# Patient Record
Sex: Male | Born: 1935 | Race: White | Hispanic: No | Marital: Married | Smoking: Former smoker
Health system: Southern US, Community
[De-identification: ages and names within clinical notes are randomized; demographics above are authoritative.]

## PROBLEM LIST (undated history)

## (undated) DIAGNOSIS — K219 Gastro-esophageal reflux disease without esophagitis: Secondary | ICD-10-CM

## (undated) DIAGNOSIS — I10 Essential (primary) hypertension: Secondary | ICD-10-CM

## (undated) DIAGNOSIS — N183 Chronic kidney disease, stage 3 unspecified: Secondary | ICD-10-CM

## (undated) DIAGNOSIS — N400 Enlarged prostate without lower urinary tract symptoms: Secondary | ICD-10-CM

## (undated) DIAGNOSIS — E119 Type 2 diabetes mellitus without complications: Secondary | ICD-10-CM

## (undated) DIAGNOSIS — I639 Cerebral infarction, unspecified: Secondary | ICD-10-CM

## (undated) HISTORY — PX: APPENDECTOMY: SHX54

## (undated) HISTORY — PX: CHOLECYSTECTOMY: SHX55

---

## 2005-10-22 ENCOUNTER — Other Ambulatory Visit: Payer: Self-pay

## 2005-10-22 ENCOUNTER — Inpatient Hospital Stay: Payer: Self-pay

## 2005-11-11 ENCOUNTER — Emergency Department: Payer: Self-pay

## 2005-11-11 ENCOUNTER — Other Ambulatory Visit: Payer: Self-pay

## 2006-04-19 ENCOUNTER — Other Ambulatory Visit: Payer: Self-pay

## 2006-04-19 ENCOUNTER — Emergency Department: Payer: Self-pay | Admitting: Emergency Medicine

## 2007-03-28 ENCOUNTER — Ambulatory Visit: Payer: Self-pay

## 2007-05-01 ENCOUNTER — Ambulatory Visit: Payer: Self-pay

## 2009-02-17 ENCOUNTER — Inpatient Hospital Stay: Payer: Self-pay | Admitting: Internal Medicine

## 2012-03-02 ENCOUNTER — Emergency Department (HOSPITAL_COMMUNITY): Payer: PRIVATE HEALTH INSURANCE

## 2012-03-02 ENCOUNTER — Observation Stay (HOSPITAL_COMMUNITY)
Admission: EM | Admit: 2012-03-02 | Discharge: 2012-03-04 | Disposition: A | Discharge status: Home Health | Payer: PRIVATE HEALTH INSURANCE | Attending: Family Medicine | Admitting: Family Medicine

## 2012-03-02 ENCOUNTER — Observation Stay (HOSPITAL_COMMUNITY): Payer: PRIVATE HEALTH INSURANCE

## 2012-03-02 ENCOUNTER — Encounter (HOSPITAL_COMMUNITY): Payer: Self-pay | Admitting: *Deleted

## 2012-03-02 DIAGNOSIS — D181 Lymphangioma, any site: Secondary | ICD-10-CM | POA: Insufficient documentation

## 2012-03-02 DIAGNOSIS — J189 Pneumonia, unspecified organism: Secondary | ICD-10-CM

## 2012-03-02 DIAGNOSIS — R42 Dizziness and giddiness: Secondary | ICD-10-CM

## 2012-03-02 DIAGNOSIS — E1010 Type 1 diabetes mellitus with ketoacidosis without coma: Secondary | ICD-10-CM | POA: Insufficient documentation

## 2012-03-02 DIAGNOSIS — R5381 Other malaise: Principal | ICD-10-CM | POA: Insufficient documentation

## 2012-03-02 DIAGNOSIS — I498 Other specified cardiac arrhythmias: Secondary | ICD-10-CM | POA: Insufficient documentation

## 2012-03-02 DIAGNOSIS — R55 Syncope and collapse: Secondary | ICD-10-CM

## 2012-03-02 DIAGNOSIS — R531 Weakness: Secondary | ICD-10-CM

## 2012-03-02 DIAGNOSIS — E1110 Type 2 diabetes mellitus with ketoacidosis without coma: Secondary | ICD-10-CM | POA: Diagnosis present

## 2012-03-02 DIAGNOSIS — R110 Nausea: Secondary | ICD-10-CM | POA: Insufficient documentation

## 2012-03-02 DIAGNOSIS — K219 Gastro-esophageal reflux disease without esophagitis: Secondary | ICD-10-CM | POA: Insufficient documentation

## 2012-03-02 DIAGNOSIS — Z8673 Personal history of transient ischemic attack (TIA), and cerebral infarction without residual deficits: Secondary | ICD-10-CM | POA: Insufficient documentation

## 2012-03-02 DIAGNOSIS — I10 Essential (primary) hypertension: Secondary | ICD-10-CM | POA: Insufficient documentation

## 2012-03-02 HISTORY — DX: Type 2 diabetes mellitus without complications: E11.9

## 2012-03-02 HISTORY — DX: Essential (primary) hypertension: I10

## 2012-03-02 LAB — CBC
Hemoglobin: 15.3 g/dL (ref 13.0–17.0)
MCH: 29.5 pg (ref 26.0–34.0)
Platelets: 234 10*3/uL (ref 150–400)
RBC: 5.19 MIL/uL (ref 4.22–5.81)
WBC: 10.3 10*3/uL (ref 4.0–10.5)

## 2012-03-02 LAB — COMPREHENSIVE METABOLIC PANEL
Albumin: 3.3 g/dL — ABNORMAL LOW (ref 3.5–5.2)
Alkaline Phosphatase: 82 U/L (ref 39–117)
BUN: 18 mg/dL (ref 6–23)
CO2: 23 mEq/L (ref 19–32)
Chloride: 100 mEq/L (ref 96–112)
GFR calc Af Amer: >90 mL/min (ref >90)
GFR calc non Af Amer: 80 mL/min — ABNORMAL LOW (ref >90)
Glucose, Bld: 289 mg/dL — ABNORMAL HIGH (ref 70–99)
Potassium: 4.8 mEq/L (ref 3.5–5.1)
Total Bilirubin: 0.2 mg/dL — ABNORMAL LOW (ref 0.3–1.2)

## 2012-03-02 LAB — CBC WITH DIFFERENTIAL/PLATELET
Basophils Absolute: 0 10*3/uL (ref 0.0–0.1)
Basophils Relative: 0 % (ref 0–1)
Hemoglobin: 15.4 g/dL (ref 13.0–17.0)
MCHC: 34.5 g/dL (ref 30.0–36.0)
Monocytes Relative: 6 % (ref 3–12)
Neutro Abs: 8.7 10*3/uL — ABNORMAL HIGH (ref 1.7–7.7)
Neutrophils Relative %: 78 % — ABNORMAL HIGH (ref 43–77)
RDW: 12.7 % (ref 11.5–15.5)

## 2012-03-02 LAB — URINALYSIS, ROUTINE W REFLEX MICROSCOPIC
Glucose, UA: >1000 mg/dL — AB
Ketones, ur: NEGATIVE mg/dL
Leukocytes, UA: NEGATIVE
Protein, ur: NEGATIVE mg/dL

## 2012-03-02 LAB — CREATININE, SERUM
Creatinine, Ser: 0.81 mg/dL (ref 0.50–1.35)
GFR calc Af Amer: >90 mL/min (ref >90)

## 2012-03-02 LAB — POCT I-STAT TROPONIN I: Troponin i, poc: 0.02 ng/mL (ref 0.00–0.08)

## 2012-03-02 LAB — PRO B NATRIURETIC PEPTIDE: Pro B Natriuretic peptide (BNP): 110.9 pg/mL (ref 0–450)

## 2012-03-02 LAB — TROPONIN I: Troponin I: <0.3 ng/mL (ref <0.30)

## 2012-03-02 LAB — URINE MICROSCOPIC-ADD ON

## 2012-03-02 LAB — GLUCOSE, CAPILLARY: Glucose-Capillary: 256 mg/dL — ABNORMAL HIGH (ref 70–99)

## 2012-03-02 MED ORDER — SODIUM CHLORIDE 0.9 % IJ SOLN
3.0000 mL | Freq: Two times a day (BID) | INTRAMUSCULAR | Status: DC
  Administered 2012-03-02 – 2012-03-04 (×3): 3 mL via INTRAVENOUS

## 2012-03-02 MED ORDER — DIPYRIDAMOLE 50 MG PO TABS
50.0000 mg | ORAL_TABLET | Freq: Two times a day (BID) | ORAL | Status: DC
  Administered 2012-03-02 – 2012-03-03 (×2): 50 mg via ORAL
  Filled 2012-03-02 (×3): qty 1

## 2012-03-02 MED ORDER — HEPARIN SODIUM (PORCINE) 5000 UNIT/ML IJ SOLN
5000.0000 [IU] | Freq: Three times a day (TID) | INTRAMUSCULAR | Status: DC
  Administered 2012-03-02 – 2012-03-04 (×6): 5000 [IU] via SUBCUTANEOUS
  Filled 2012-03-02 (×8): qty 1

## 2012-03-02 MED ORDER — DEXTROSE 5 % IV SOLN
1.0000 g | INTRAVENOUS | Status: DC
  Administered 2012-03-02: 1 g via INTRAVENOUS
  Filled 2012-03-02 (×2): qty 10

## 2012-03-02 MED ORDER — INSULIN ASPART 100 UNIT/ML ~~LOC~~ SOLN
0.0000 [IU] | Freq: Every day | SUBCUTANEOUS | Status: DC
  Administered 2012-03-02: 3 [IU] via SUBCUTANEOUS
  Administered 2012-03-03: 2 [IU] via SUBCUTANEOUS

## 2012-03-02 MED ORDER — SODIUM CHLORIDE 0.9 % IV BOLUS (SEPSIS): 500.0000 mL | Freq: Once | INTRAVENOUS | Status: DC

## 2012-03-02 MED ORDER — GLYBURIDE 1.25 MG PO TABS: 1.2500 mg | ORAL_TABLET | Freq: Every day | ORAL | Status: DC

## 2012-03-02 MED ORDER — MECLIZINE HCL 25 MG PO TABS
25.0000 mg | ORAL_TABLET | Freq: Once | ORAL | Status: AC
  Administered 2012-03-02: 25 mg via ORAL
  Filled 2012-03-02: qty 1

## 2012-03-02 MED ORDER — INSULIN ASPART 100 UNIT/ML ~~LOC~~ SOLN
0.0000 [IU] | Freq: Three times a day (TID) | SUBCUTANEOUS | Status: DC
  Administered 2012-03-03: 5 [IU] via SUBCUTANEOUS
  Administered 2012-03-03: 2 [IU] via SUBCUTANEOUS
  Administered 2012-03-03: 100 [IU] via SUBCUTANEOUS
  Administered 2012-03-04: 2 [IU] via SUBCUTANEOUS
  Administered 2012-03-04: 3 [IU] via SUBCUTANEOUS

## 2012-03-02 MED ORDER — SODIUM CHLORIDE 0.9 % IV SOLN
INTRAVENOUS | Status: DC
  Administered 2012-03-02 – 2012-03-04 (×3): via INTRAVENOUS

## 2012-03-02 MED ORDER — ONDANSETRON HCL 4 MG/2ML IJ SOLN: 4.0000 mg | Freq: Four times a day (QID) | INTRAMUSCULAR | Status: DC | PRN

## 2012-03-02 MED ORDER — ACETAMINOPHEN 325 MG PO TABS: 650.0000 mg | ORAL_TABLET | Freq: Four times a day (QID) | ORAL | Status: DC | PRN

## 2012-03-02 MED ORDER — ONDANSETRON HCL 4 MG/2ML IJ SOLN
4.0000 mg | Freq: Once | INTRAMUSCULAR | Status: AC
  Administered 2012-03-02: 4 mg via INTRAVENOUS
  Filled 2012-03-02: qty 2

## 2012-03-02 MED ORDER — METFORMIN HCL 500 MG PO TABS
250.0000 mg | ORAL_TABLET | Freq: Every day | ORAL | Status: DC
  Administered 2012-03-03: 250 mg via ORAL
  Filled 2012-03-02 (×2): qty 1

## 2012-03-02 MED ORDER — ONDANSETRON HCL 4 MG PO TABS: 4.0000 mg | ORAL_TABLET | Freq: Four times a day (QID) | ORAL | Status: DC | PRN

## 2012-03-02 MED ORDER — AMLODIPINE BESYLATE 10 MG PO TABS
10.0000 mg | ORAL_TABLET | Freq: Every day | ORAL | Status: DC
  Administered 2012-03-02 – 2012-03-04 (×3): 10 mg via ORAL
  Filled 2012-03-02 (×3): qty 1

## 2012-03-02 MED ORDER — ACETAMINOPHEN 650 MG RE SUPP: 650.0000 mg | Freq: Four times a day (QID) | RECTAL | Status: DC | PRN

## 2012-03-02 MED ORDER — AZITHROMYCIN 500 MG PO TABS
500.0000 mg | ORAL_TABLET | Freq: Every day | ORAL | Status: DC
  Administered 2012-03-02 – 2012-03-03 (×2): 500 mg via ORAL
  Filled 2012-03-02 (×2): qty 1

## 2012-03-02 MED ORDER — PANTOPRAZOLE SODIUM 40 MG PO TBEC
40.0000 mg | DELAYED_RELEASE_TABLET | Freq: Every day | ORAL | Status: DC
  Administered 2012-03-02 – 2012-03-04 (×3): 40 mg via ORAL
  Filled 2012-03-02 (×3): qty 1

## 2012-03-02 MED ORDER — GLYBURIDE 2.5 MG PO TABS
3.7500 mg | ORAL_TABLET | Freq: Every day | ORAL | Status: DC
  Administered 2012-03-03: 3.75 mg via ORAL
  Filled 2012-03-02 (×2): qty 1

## 2012-03-02 MED ORDER — HYDRALAZINE HCL 20 MG/ML IJ SOLN
5.0000 mg | INTRAMUSCULAR | Status: DC | PRN
  Filled 2012-03-02 (×2): qty 0.25

## 2012-03-02 NOTE — ED Notes (Signed)
Carb Mod Diet ordered  

## 2012-03-02 NOTE — ED Notes (Signed)
Pt here per [facility] with complaint of nausea.  Pt advises he was nauseated all morning but has not vomited. [facility] started IV and administered Zofran 4 mg IVP.

## 2012-03-02 NOTE — ED Provider Notes (Signed)
History     CSN: [id number]  Arrival date & time 03/02/12  1221   First MD Initiated Contact with Patient 03/02/12 1351      Chief Complaint  Patient presents with  . Nausea    (Consider location/radiation/quality/duration/timing/severity/associated sxs/prior treatment) Patient is a 79 y.o. male presenting with neurologic complaint.  Neurologic Problem The primary symptoms include dizziness and nausea. Primary symptoms do not include headaches, visual change, paresthesias, focal weakness, loss of sensation, speech change, memory loss, fever or vomiting. The symptoms began 2 to 6 hours ago. The symptoms are unchanged. The neurological symptoms are diffuse. Context: this morning after BM.  He describes the dizziness as lightheadedness. The dizziness began today. The dizziness has been unchanged since its onset. Dizziness is a new problem. Associated with: unknown. Dizziness also occurs with nausea and weakness. Dizziness does not occur with blurred vision, vomiting or diaphoresis.  Nausea began today. Associated with: mvmt. The nausea is exacerbated by motion.  Additional symptoms include weakness and loss of balance. Additional symptoms do not include neck stiffness, lower back pain or leg pain. Medical issues also include hypertension.    Past Medical History  Diagnosis Date  . Diabetes mellitus without complication   . Hypertension     Past Surgical History  Procedure Laterality Date  . Cholecystectomy      No family history on file.  History  Substance Use Topics  . Smoking status: Never Smoker   . Smokeless tobacco: Not on file  . Alcohol Use: No      Review of Systems  Constitutional: Negative for fever, chills, diaphoresis, activity change, appetite change and fatigue.  HENT: Negative for congestion, sore throat, facial swelling, rhinorrhea, drooling, neck pain, neck stiffness and voice change.   Eyes: Negative for blurred vision.  Respiratory: Negative for  shortness of breath and stridor.   Gastrointestinal: Positive for nausea. Negative for vomiting, abdominal pain, diarrhea and abdominal distention.  Endocrine: Negative for polydipsia and polyuria.  Genitourinary: Negative for dysuria, urgency, frequency and decreased urine volume.  Musculoskeletal: Negative for back pain and gait problem.  Skin: Negative for color change and wound.  Neurological: Positive for dizziness, weakness and loss of balance. Negative for speech change, focal weakness, facial asymmetry, numbness, headaches and paresthesias.  Hematological: Does not bruise/bleed easily.  Psychiatric/Behavioral: Negative for memory loss, confusion and agitation.    Allergies  Review of patient's allergies indicates no known allergies.  Home Medications   Current Outpatient Rx  Name  Route  Sig  Dispense  Refill  . amLODipine (NORVASC) 10 MG tablet   Oral   Take 10 mg by mouth daily.         Marland Kitchen dipyridamole (PERSANTINE) 50 MG tablet   Oral   Take 50 mg by mouth 2 (two) times daily.         Marland Kitchen glyBURIDE (DIABETA) 2.5 MG tablet   Oral   Take 3.75 mg by mouth daily with breakfast.         . metFORMIN (GLUCOPHAGE) 500 MG tablet   Oral   Take 250 mg by mouth daily.         . Multiple Vitamin (MULTIVITAMIN WITH MINERALS) TABS   Oral   Take 1 tablet by mouth daily.         Marland Kitchen omeprazole (PRILOSEC) 20 MG capsule   Oral   Take 20 mg by mouth daily.           BP 162/93  Pulse 55  Temp(Src) 97.6 F (36.4 C) (Oral)  Resp 21  Ht 6' (1.829 m)  Wt 190 lb (86.183 kg)  BMI 25.76 kg/m2  SpO2 95%  Physical Exam  Constitutional: He is oriented to person, place, and time. He appears well-developed and well-nourished. No distress.  HENT:  Head: Normocephalic and atraumatic.  Mouth/Throat: No oropharyngeal exudate.  Eyes: Pupils are equal, round, and reactive to light.  Neck: Normal range of motion. Neck supple.  Cardiovascular: Normal rate, regular rhythm and  normal heart sounds.  Exam reveals no gallop and no friction rub.   No murmur heard. Pulmonary/Chest: Effort normal and breath sounds normal. No respiratory distress. He has no wheezes. He has no rales.  Abdominal: Soft. Bowel sounds are normal. He exhibits no distension and no mass. There is tenderness in the suprapubic area. There is no rigidity, no rebound and no guarding.  Musculoskeletal: Normal range of motion. He exhibits no edema and no tenderness.  Neurological: He is alert and oriented to person, place, and time.  Skin: Skin is warm and dry.  Psychiatric: He has a normal mood and affect.    ED Course  Procedures (including critical care time)  Labs Reviewed  GLUCOSE, CAPILLARY - Abnormal; Notable for the following:    Glucose-Capillary 259 (*)    All other components within normal limits  COMPREHENSIVE METABOLIC PANEL - Abnormal; Notable for the following:    Sodium 134 (*)    Glucose, Bld 289 (*)    Albumin 3.3 (*)    Total Bilirubin 0.2 (*)    GFR calc non Af Amer 80 (*)    All other components within normal limits  URINALYSIS, ROUTINE W REFLEX MICROSCOPIC - Abnormal; Notable for the following:    Glucose, UA >1000 (*)    All other components within normal limits  CBC WITH DIFFERENTIAL - Abnormal; Notable for the following:    WBC 11.1 (*)    Neutrophils Relative 78 (*)    Neutro Abs 8.7 (*)    All other components within normal limits  URINE CULTURE  URINE MICROSCOPIC-ADD ON  POCT I-STAT TROPONIN I   [name] Contrast  03/02/2012  *RADIOLOGY REPORT*  Clinical Data: Slurred speech and memory loss.  Nausea and dizziness.  Diabetes and hypertension  MRI HEAD WITHOUT CONTRAST  Technique:  Multiplanar, multiecho pulse sequences of the brain and surrounding structures were obtained according to standard protocol without intravenous contrast.  Comparison: None  Findings: Moderate to advanced atrophy.  Marked chronic ischemic changes are present throughout the white  matter bilaterally. 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 Chronic ischemia in the pons and right cerebellum.  Negative for acute infarct.  Negative for mass lesion.  CSF hygroma is present left parietal region.  This has CSF signal characteristics.  There is mild midline shift to the right of 3 mm.  IMPRESSION: Advanced atrophy and chronic ischemic changes.  No acute infarct.  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   Original Report Authenticated By: [name], M.D.      1. Dizziness   2. Nausea       MDM  Pt is a 79 y.o. male with pertinent PMHX of DM, HTN, CVA who presents with complaint of feeling unwell starting this morning.  He reports he feels like he can't move because it makes him feel "bad". Further description is difficult but includes nausea and lightheadedness, worse with movement. Pt unable to  say if symptoms came on gradually or suddenly, but occurred this morning after he had a BM and tried to get back into bed.  Denies h/a, CP, SOB, fever, chills cough.  +suprapubic pain, and chronic urinary frequency, but no dysuria.  On PE, HR variable.  BP 174/94.  A@O  x3.  Neuro exam pertinent for R ptosis which family describes as chronic, as well as dysmetria of L hand.  Pt & family unable to say if he has difficulty with coordination in the past. Ddx includes posterior circulation CVA, BPPV, infection including viral syndrome, or UTI,   5:24PM Pt feels no better after zofran and meclizine, unable to sit up in bed due to severity of symptoms.  MRI w/ no acute ischemic changes, CSF hygroma.  Labs pertinent for mild WBC elevation.  No UTI.  Trop not elevated.  I feel PBBV or other peripheral vertigo most likely; however, given severity of symptoms, Fam medicine consulted for consideration of admission for observation  1. Dizziness   2. Nausea      Labs and imaging considered in decision making, reviewed by myself.  Imaging  interpreted by radiology. Pt care discussed with my attending, Dr. Preston Fleeting.    Toy Cookey, MD 03/02/12 1739

## 2012-03-02 NOTE — ED Notes (Signed)
PT. READY FOR TRANSPORT TO [facility] UN[address] , WAITING CALL BACK FROM FLOOR NURSE . DENIES PAIN OR DISCOMFORT , NO NAUSEA , RESPIRATIONS UNLABORED , IV SITE UNREMARKABLE.

## 2012-03-02 NOTE — ED Notes (Signed)
EKG given to MD, no old EKG, copy placed in chart. 

## 2012-03-02 NOTE — ED Notes (Signed)
GIVEN TO [facility] NURSE , TRANSPORTED IN STABLE CONDITION .

## 2012-03-02 NOTE — ED Provider Notes (Signed)
79 year old male had onset this morning of nausea and dizziness. He feels that he can't stand up. He has difficulty describing his sense of dizziness. Family is present and states that his speech is slightly slurred compared with his baseline. Dizziness is reproduced by head movement but there is no nystagmus. While laying in bed, he is moving his head around almost constantly. There no carotid bruits. There no focal motor or sensory deficits. He does have history of strokes and mini strokes. He will be sent for MR scan to rule out posterior circulation stroke, and is given a dose of meclizine. He was given ondansetron for nausea.   Date: 03/02/2012  Rate: 93  Rhythm: normal sinus rhythm and premature ventricular contractions (PVC)  QRS Axis: left  Intervals: normal  ST/T Wave abnormalities: normal  Conduction Disutrbances:left anterior fascicular block  Narrative Interpretation: Frequent PVCs with episodes of bigeminy, left anterior fascicular block, low-voltage. No prior ECG available for comparison.  Old EKG Reviewed: none available    I saw and evaluated the patient, reviewed the resident's note and I agree with the findings and plan.   Dione Booze, MD 03/03/12 [id number]

## 2012-03-02 NOTE — Progress Notes (Signed)
Spoke with patient placement and stated that patient could not go to neurology floor. Patient is only for "observation:"  Will accept patient .

## 2012-03-02 NOTE — Progress Notes (Signed)
[facility] interval progress note  Spoke with Dr. Yetta Barre, Neurosurgeon on call, regarding patients MRI findings of a cystic hygroma. Stated from looking at the images it appeared to be a chronic lesion. Stated that with the patients presentation of upper extremity weakness it would be more likely that this would be a central cord syndrome and recommended MRI of the neck to evaluate. On the current images he could see down to C3 level and there did not appear to be any abnormalities there. He plans on seeing the patient and we appreciate his assistance in this case.  Marikay Alar, MD PGY1 [facility]

## 2012-03-02 NOTE — H&P (Signed)
PCP: Dr. Loma Sender [facility])  Chief Complaint: weakness  Assessment and plan: This is an 79 year old Caucasian male with a history of diabetes mellitus, hypertension, and past medical history of stroke who presents with weakness.  MRI negative for new stroke but significant for cystic hygroma on left with mild midline shift to right   D/Dx: presyncope following bowel movement, hyperglycemia, symptomatic from cystic hygroma, infection (cause unclear at this time), vertigo (BPPV), TIA.  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   # Weakness # Lightheadedness, dizziness # Hyperglycemia # Mild leukocytosis on admission. WBC 11.1.  # Nausea, abdominal pain. May be related to hyperglycemia or potential vertigo.  -IVF, see below -Check HgbA1c -Check pro-BNP, troponin -Check 2V-CXR -Follow-up urine culture  -Repeat AM WBC -SSI -Orthostatic vital signs  -We spoke with radiologist Dr. Lacy Duverney regarding MRI findings. Cystic hygroma may result from tearing of arachnoid. Patient may or may not be symptomatic from this (headache, weakness, etc.). He recommends neurosurgery consultation due to his having symptoms and size of cystic hygroma>>>Neurosurgery consulted.   GI # History of GERD -Home PPI  ENDO # History of diabetes mellitus -See above -Continue home glipizide and metformin starting tomorrow  CV # History of hypertension  -Continue home Norvasc 10 # History of stroke -Continue home persantine  FEN/GI # IVF: 500 mL fluid bolus. If pro-BNP, CXR are okay, another 500 mL. Then NS @ 100 mL/hr  PPx # DVT PPx: heparin SQ  DISPO: pending clinical improvement -PT/OT  CODE: FULL   HPI: The patient had a bowel movement today and afterward he felt weak. He was able to walk the two or so steps to his bed and  laid down. He called his daughter next door who came over. She says his "color wasn't right" and confirms that he was not able to get out of bed. She called EMS at that time. She denies that he was confused or cognition different from his usual when she came over.  He lives alone but she and her brother check in on him often throughout the day. She does not report any concerns with his ability to care for himself.  Besides weakness, he also reports vertigo. It is unclear whether he felt the room spinning at that time, however, he does report that he has had these symptoms today. He denies currently.   At this time, besides his weakness, he also reports feeling nauseous. He denies abdominal pain when laying down but during physical exam when he sat up, he reported pain in his belly. He last ate this morning (breakfast). He denies being hungry at this time.   Daughter is fairly confident he took his morning medications, including his diabetes medications.   Past Medical History  Diagnosis Date  . Diabetes mellitus without complication   . Hypertension   History of stroke.  Macular degeneration on right eye.   Past Surgical History  Procedure Laterality Date  . Cholecystectomy      No family history on file. Social History:  reports that he has never smoked. He does not have any smokeless tobacco history on file. He reports that he does not drink alcohol or use illicit drugs. See HPI He quit smoking after his distant stroke  Allergies: No Known Allergies   Results for orders placed during  the hospital encounter of 03/02/12 (from the past 48 hour(s))  GLUCOSE, CAPILLARY     Status: Abnormal   Collection Time    03/02/12  2:01 PM      Result Value Range   Glucose-Capillary 259 (*) 70 - 99 mg/dL  URINALYSIS, ROUTINE W REFLEX MICROSCOPIC     Status: Abnormal   Collection Time    03/02/12  2:40 PM      Result Value Range   Color, Urine YELLOW  YELLOW   APPearance CLEAR  CLEAR    Specific Gravity, Urine 1.024  1.005 - 1.030   pH 7.0  5.0 - 8.0   Glucose, UA >1000 (*) NEGATIVE mg/dL   Hgb urine dipstick NEGATIVE  NEGATIVE   Bilirubin Urine NEGATIVE  NEGATIVE   Ketones, ur NEGATIVE  NEGATIVE mg/dL   Protein, ur NEGATIVE  NEGATIVE mg/dL   Urobilinogen, UA 0.2  0.0 - 1.0 mg/dL   Nitrite NEGATIVE  NEGATIVE   Leukocytes, UA NEGATIVE  NEGATIVE  URINE MICROSCOPIC-ADD ON     Status: None   Collection Time    03/02/12  2:40 PM      Result Value Range   Squamous Epithelial / LPF RARE  RARE   WBC, UA 0-2  <3 WBC/hpf   RBC / HPF 0-2  <3 RBC/hpf   Bacteria, UA RARE  RARE  COMPREHENSIVE METABOLIC PANEL     Status: Abnormal   Collection Time    03/02/12  2:45 PM      Result Value Range   Sodium 134 (*) 135 - 145 mEq/L   Potassium 4.8  3.5 - 5.1 mEq/L   Chloride 100  96 - 112 mEq/L   CO2 23  19 - 32 mEq/L   Glucose, Bld 289 (*) 70 - 99 mg/dL   BUN 18  6 - 23 mg/dL   Creatinine, Ser 1.61  0.50 - 1.35 mg/dL   Calcium 9.2  8.4 - 09.6 mg/dL   Total Protein 7.2  6.0 - 8.3 g/dL   Albumin 3.3 (*) 3.5 - 5.2 g/dL   AST 21  0 - 37 U/L   ALT 21  0 - 53 U/L   Alkaline Phosphatase 82  39 - 117 U/L   Total Bilirubin 0.2 (*) 0.3 - 1.2 mg/dL   GFR calc non Af Amer 80 (*) >90 mL/min   GFR calc Af Amer >90  >90 mL/min   Comment:            The eGFR has been calculated     using the CKD EPI equation.     This calculation has not been     validated in all clinical     situations.     eGFR's persistently     <90 mL/min signify     possible Chronic Kidney Disease.  POCT I-STAT TROPONIN I     Status: None   Collection Time    03/02/12  3:05 PM      Result Value Range   Troponin i, poc 0.02  0.00 - 0.08 ng/mL   Comment 3            Comment: Due to the release kinetics of cTnI,     a negative result within the first hours     of the onset of symptoms does not rule out     myocardial infarction with certainty.     If myocardial infarction is still suspected,     repeat the  test  at appropriate intervals.  CBC WITH DIFFERENTIAL     Status: Abnormal   Collection Time    03/02/12  3:33 PM      Result Value Range   WBC 11.1 (*) 4.0 - 10.5 K/uL   RBC 5.31  4.22 - 5.81 MIL/uL   Hemoglobin 15.4  13.0 - 17.0 g/dL   HCT 16.1  09.6 - 04.5 %   MCV 84.2  78.0 - 100.0 fL   MCH 29.0  26.0 - 34.0 pg   MCHC 34.5  30.0 - 36.0 g/dL   RDW 40.9  81.1 - 91.4 %   Platelets 211  150 - 400 K/uL   Neutrophils Relative 78 (*) 43 - 77 %   Neutro Abs 8.7 (*) 1.7 - 7.7 K/uL   Lymphocytes Relative 16  12 - 46 %   Lymphs Abs 1.8  0.7 - 4.0 K/uL   Monocytes Relative 6  3 - 12 %   Monocytes Absolute 0.7  0.1 - 1.0 K/uL   Eosinophils Relative 0  0 - 5 %   Eosinophils Absolute 0.0  0.0 - 0.7 K/uL   Basophils Relative 0  0 - 1 %   Basophils Absolute 0.0  0.0 - 0.1 K/uL   [name] Contrast  03/02/2012  *RADIOLOGY REPORT*  Clinical Data: Slurred speech and memory loss.  Nausea and dizziness.  Diabetes and hypertension  MRI HEAD WITHOUT CONTRAST  Technique:  Multiplanar, multiecho pulse sequences of the brain and surrounding structures were obtained according to standard protocol without intravenous contrast.  Comparison: None  Findings: Moderate to advanced atrophy.  Marked chronic ischemic changes are present throughout the white matter bilaterally. 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 Chronic ischemia in the pons and right cerebellum.  Negative for acute infarct.  Negative for mass lesion.  CSF hygroma is present left parietal region.  This has CSF signal characteristics.  There is mild midline shift to the right of 3 mm.  IMPRESSION: Advanced atrophy and chronic ischemic changes.  No acute infarct.  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   Original Report Authenticated By: [name], M.D.     ROS Denies vision or hearing changes. He has decreased vision in right eye from macular degeneration. He denies vision is worse  than at baseline.  Denies fevers/chills Denies difficulty breathing Denies chest pain, palpitations Endorses nausea without vomiting and abdominal pain when he is in certain positions Denies worsening of chronic lower extremity swelling   Blood pressure 162/93, pulse 55, temperature 97.6 F (36.4 C), temperature source Oral, resp. rate 21, height 6' (1.829 m), weight 190 lb (86.183 kg), SpO2 95.00%. Physical Exam  GEN: NAD; well-nourished, -appearing PSYCH: appropriate to questions; alert and oriented to name, place/city, month and year; 2/3 recall; appropriate to questions although his short-term memory regarding today's events not fully intact; daughter helping supplement his history HEENT:   Head: [place]/AT   Eyes: normal conjunctiva without injection or tearing; mild right eyelid droop   Ears: non-tender; cerumen bilaterally so difficult to visualize TM   Nose: no rhinorrhea, normal turbinates   Mouth: MMM; dentures NECK: no LAD CV: RRR, distant heart sounds, no obvious murmurs PULM: NI WOB; soft crackles left lower back; no wheezing or ronchi ABD: soft, moderate tenderness in left and right upper quadrants to deep palpation, obese EXT: 1+ pitting pretibial edema; 2+ distal pulses NEURO:    CN: PERRLA; EOMI, mild right-sided  facial droop; symmetric eyebrow raise; symmetric protrusion of tongue   No nystagmus    Left finger dysmetria on finger-to-nose    3-4/5 strength in right and left upper and lower extremities   He refused to stand up. He reported lightheadedness after sitting up and then immediately laid back down.  SKIN/CLOTHES: underwear has urine stain (daughter reports he is continent at home)   [facility], [facility] 03/02/2012, 5:43 PM

## 2012-03-02 NOTE — H&P (Addendum)
[facility]MTS Attending Admit Note Patient seen and examined by me, discussed with Dr Madolyn Frieze and I agree with her assessment and plan as documented. Briefly, an 87yoM , R-hand dominant, community-dwelling, who presents via EMS for complaint of acute-onset generalized weakness earlier today when going to use the bathroom.  Ambulates with "walking stick" (cane), lives alone with daughter next door.  She is present at time of my interview and reports that he was laying on his bed unable to get up due to 'dizziness' and general weakness.  Of note, patient had a prior CVA (23 years ago) without focal residual weakness.  MRI done in the ED did not find evidence of new/acute stroke. At the time of onset of his acute symptoms, patient reports that he had some nausea without emesis.  No fevers or chills; no cough, no chest pain or palpitations.  No presyncope or syncope.  Denies feeling short of breath or having sputum production.  No tinnitus or changes in sense of hearing. He has not been in a healthcare setting or hospital recently, and he has not been on any antibiotics.  He does not have any known medication allergies.  He had been a smoker until his stroke 23 years ago.   Exam: Well appearing, no apparent distress. No focal motor weakness in handgrip, UE or LEs.  Smile symmetric; tongue midline.  No slurred speech.  COR regular S1S2. PULM Focal crackles audible in L lower lung field.  Assess/Plan: 87yoM with acute onset generalized weakness, with extensive workup that yields only the LLL infiltrate on CXR that is consistent with exam findings of rales in LLL.  Other items in the differential for this complaint, such as anemia/blood loss, dysrhythmia, new stroke, UTI, menieres disease, hypotension/dehydration, are not supported by his exam, vital signs, or studies done thus far.  He reports that he is feeling better now than he did earlier today.  Would favor treating for CAP and close monitoring; PT/OT evaluation in AM  to determine functional status prior to consideration for discharge.  Observation for declaration of other symptoms/findings that might add to understanding of his condition.  Paula Compton, MD  FAMILY CONTACT INFO:  Daughter Steward Drone (first contact) [phone], [phone] Lonny Prude [phone]

## 2012-03-02 NOTE — Consult Note (Signed)
Reason for Consult:?subdural hygroma Referring Physician: Family practice  Roberto Griffin is an 79 y.o. male.   HPI:  79 yo gentleman admitted by family practice. Patient states that after going to the bathroom this morning around 10:00 he noted the onset of nausea and dizziness. He called his daughter who came to check on him and found him to be lying on the bed and somewhat pale in color. They brought into the emergency department concerned about "mini stroke."  He was evaluated with an MRI of the brain which suggested a left subdural hygroma and neurosurgical evaluation was requested. Patient denies a fall, denies headache, denies numbness tingling or weakness other than a generalized feeling of weakness, denies loss of consciousness, denies neck pain, denies weakness or tingling in the hands.  Past Medical History  Diagnosis Date  . Diabetes mellitus without complication   . Hypertension     Past Surgical History  Procedure Laterality Date  . Cholecystectomy      No Known Allergies  History  Substance Use Topics  . Smoking status: Never Smoker   . Smokeless tobacco: Not on file  . Alcohol Use: No    No family history on file.   Review of Systems  Positive ROS: neg  All other systems have been reviewed and were otherwise negative with the exception of those mentioned in the HPI and as above.  Objective: Vital signs in last 24 hours: Temp:  [97.6 F (36.4 C)-97.8 F (36.6 C)] 97.8 F (36.6 C) (02/20 2114) Pulse Rate:  [50-79] 74 (02/20 2114) Resp:  [14-21] 16 (02/20 2114) BP: (159-179)/(76-94) 178/82 mmHg (02/20 2114) SpO2:  [93 %-100 %] 100 % (02/20 2114) Weight:  [86.183 kg (190 lb)-96.117 kg (211 lb 14.4 oz)] 96.117 kg (211 lb 14.4 oz) (02/20 2114)  General Appearance: Alert, cooperative, no distress, appears stated age Head: Normocephalic, without obvious abnormality, atraumatic Eyes: PERRL, conjunctiva/corneas clear, EOM's intact     Throat: benign Neck:  Supple, symmetrical, trachea midline Heart: Regular rate and rhythm, S1 and S2 normal, no murmur, rub or gallop Abdomen: Soft, non-tender, bowel sounds active all four quadrants, no masses, no organomegaly Extremities: Extremities normal, atraumatic, no cyanosis or edema  NEUROLOGIC:   Mental status: A&O x4, no aphasia, good attention span, Memory and fund of knowledge appears fairly normal for age Motor Exam - grossly normal with 5 out of 5 strength in the upper extremities and lower extremities to an in bed exam, normal tone and bulk, no pronator, grips equal Sensory Exam - grossly normal Reflexes: symmetric, no pathologic reflexes, No Hoffman's, No clonus Coordination - grossly normal Gait - not tested Balance -not tested Cranial Nerves: I: smell Not tested  II: visual acuity  OS: na   OD: na  II: visual fields Full to confrontation  II: pupils Equal, round, reactive to light  III,VII: ptosis None  III,IV,VI: extraocular muscles  Full ROM  V: mastication Normal  V: facial light touch sensation  Normal  V,VII: corneal reflex  Present  VII: facial muscle function - upper  Normal  VII: facial muscle function - lower Normal  VIII: hearing Not tested  IX: soft palate elevation  Normal  IX,X: gag reflex Present  XI: trapezius strength  5/5  XI: sternocleidomastoid strength 5/5  XI: neck flexion strength  5/5  XII: tongue strength  Normal    Data Review Lab Results  Component Value Date   WBC 10.3 03/02/2012   HGB 15.3 03/02/2012   HCT  43.6 03/02/2012   MCV 84.0 03/02/2012   PLT 234 03/02/2012   Lab Results  Component Value Date   NA 134* 03/02/2012   K 4.8 03/02/2012   CL 100 03/02/2012   CO2 23 03/02/2012   BUN 18 03/02/2012   CREATININE 0.81 03/02/2012   GLUCOSE 289* 03/02/2012   No results found for this basename: INR, PROTIME    Radiology: X-ray Chest Pa And Lateral   03/02/2012  *RADIOLOGY REPORT*  Clinical Data: Crackles in the left lower lung field.  CHEST - 2 VIEW   Comparison: None.  Findings: There is a small patchy area of density at the left lung base posteriorly with slight atelectasis.  There is prominent peribronchial thickening consistent bronchitis.  Heart size and vascularity are normal.  No effusions.  No acute osseous abnormality.  IMPRESSION: Small area of infiltrate and atelectasis at the left lung base. Bronchitic changes.   Original Report Authenticated By: Francene Boyers, M.D.    Roberto Griffin Contrast  03/02/2012  *RADIOLOGY REPORT*  Clinical Data: Slurred speech and memory loss.  Nausea and dizziness.  Diabetes and hypertension  MRI HEAD WITHOUT CONTRAST  Technique:  Multiplanar, multiecho pulse sequences of the brain and surrounding structures were obtained according to standard protocol without intravenous contrast.  Comparison: None  Findings: Moderate to advanced atrophy.  Marked chronic ischemic changes are present throughout the white matter bilaterally. 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 Chronic ischemia in the pons and right cerebellum.  Negative for acute infarct.  Negative for mass lesion.  CSF hygroma is present left parietal region.  This has CSF signal characteristics.  There is mild midline shift to the right of 3 mm.  IMPRESSION: Advanced atrophy and chronic ischemic changes.  No acute infarct.  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   Original Report Authenticated By: [name], M.D.    MRI: As above, clinical significance is uncertain but I doubt this is of any clinical significance and likely unrelated to today's event. He has significant generalized atrophy  Assessment/Plan: 79 year old gentleman with a subdural hygroma of likely little clinical significance and likely unrelated to today's event. We'll check head CT tomorrow. No signs of cervical spine injury or central cord injury (family practice physician felt the patient was somewhat weak in his upper extremities  while in the emergency department and there were some concern he may have fallen).   JONES,DAVID S 03/02/2012 9:16 PM

## 2012-03-03 ENCOUNTER — Encounter (HOSPITAL_COMMUNITY): Payer: Self-pay | Admitting: *Deleted

## 2012-03-03 ENCOUNTER — Observation Stay (HOSPITAL_COMMUNITY): Payer: PRIVATE HEALTH INSURANCE

## 2012-03-03 DIAGNOSIS — E1110 Type 2 diabetes mellitus with ketoacidosis without coma: Secondary | ICD-10-CM | POA: Diagnosis present

## 2012-03-03 DIAGNOSIS — R531 Weakness: Secondary | ICD-10-CM

## 2012-03-03 DIAGNOSIS — I10 Essential (primary) hypertension: Secondary | ICD-10-CM | POA: Diagnosis present

## 2012-03-03 LAB — MAGNESIUM: Magnesium: 1.9 mg/dL (ref 1.5–2.5)

## 2012-03-03 LAB — URINE CULTURE: Colony Count: 45000

## 2012-03-03 LAB — GLUCOSE, CAPILLARY: Glucose-Capillary: 208 mg/dL — ABNORMAL HIGH (ref 70–99)

## 2012-03-03 LAB — CBC
Hemoglobin: 15 g/dL (ref 13.0–17.0)
Platelets: 224 10*3/uL (ref 150–400)
RBC: 5.19 MIL/uL (ref 4.22–5.81)
WBC: 10.4 10*3/uL (ref 4.0–10.5)

## 2012-03-03 LAB — BASIC METABOLIC PANEL
Calcium: 8.9 mg/dL (ref 8.4–10.5)
GFR calc Af Amer: >90 mL/min (ref >90)
GFR calc non Af Amer: 82 mL/min — ABNORMAL LOW (ref >90)
Potassium: 3.5 mEq/L (ref 3.5–5.1)
Sodium: 138 mEq/L (ref 135–145)

## 2012-03-03 LAB — HEMOGLOBIN A1C
Hgb A1c MFr Bld: 9.8 % — ABNORMAL HIGH (ref <5.7)
Mean Plasma Glucose: 235 mg/dL — ABNORMAL HIGH (ref <117)

## 2012-03-03 MED ORDER — METOPROLOL TARTRATE 12.5 MG HALF TABLET
12.5000 mg | ORAL_TABLET | Freq: Two times a day (BID) | ORAL | Status: DC
  Administered 2012-03-03: 12.5 mg via ORAL
  Filled 2012-03-03 (×3): qty 1

## 2012-03-03 MED ORDER — POTASSIUM CHLORIDE CRYS ER 20 MEQ PO TBCR
EXTENDED_RELEASE_TABLET | ORAL | Status: AC
  Administered 2012-03-03: 40 meq
  Filled 2012-03-03: qty 2

## 2012-03-03 MED ORDER — DIPHENHYDRAMINE HCL 25 MG PO CAPS
25.0000 mg | ORAL_CAPSULE | Freq: Once | ORAL | Status: AC
  Administered 2012-03-03: 25 mg via ORAL
  Filled 2012-03-03: qty 1

## 2012-03-03 MED ORDER — POLYETHYLENE GLYCOL 3350 17 G PO PACK
17.0000 g | PACK | Freq: Every day | ORAL | Status: DC
  Administered 2012-03-03: 17 g via ORAL
  Filled 2012-03-03 (×2): qty 1

## 2012-03-03 MED ORDER — POTASSIUM CHLORIDE CRYS ER 20 MEQ PO TBCR: 40.0000 meq | EXTENDED_RELEASE_TABLET | Freq: Once | ORAL | Status: AC

## 2012-03-03 MED ORDER — CLOPIDOGREL BISULFATE 75 MG PO TABS
75.0000 mg | ORAL_TABLET | Freq: Every day | ORAL | Status: DC
  Administered 2012-03-03 – 2012-03-04 (×2): 75 mg via ORAL
  Filled 2012-03-03 (×3): qty 1

## 2012-03-03 NOTE — Progress Notes (Signed)
FMTS Attending Daily Note:  Renold Don MD  [phone] pager  Family Practice pager:  [phone] I have discussed this patient with the resident Dr. Paulina Fusi and attending physician Dr. Mauricio Po.  I agree with their findings, assessment, and care plan.    We are currently doing a more extensive workup for his weakness with the concerning findings on his EKG.  He becomes fairly weak with minimal exertion and we have consulted cardiology for further evaluation and management options.

## 2012-03-03 NOTE — Progress Notes (Signed)
  Echocardiogram 2D Echocardiogram has been performed.  KHALIQ, TURAY 03/03/2012, 6:07 PM

## 2012-03-03 NOTE — Evaluation (Signed)
Physical Therapy Evaluation Patient Details Name: STRYKER VEASEY MRN: [id number] DOB: 05-24-1924 Today's Date: 03/03/2012 Time: 4098-1191 PT Time Calculation (min): 15 min  PT Assessment / Plan / Recommendation Clinical Impression  79 yo male admitted with dizziness and weakness who is having problems with constipation and was observed with straining and valsalva when on the commode.  Pt did not feel well enough to progress with ambulation and needed to use a RW for support instead of his usual straight cane. Pt apprears deconditioned and would benefit from continued PT.  Since he lives alone, recommend ST-SNF for reutrn to functional independence    PT Assessment  Patient needs continued PT services    Follow Up Recommendations  SNF    Does the patient have the potential to tolerate intense rehabilitation      Barriers to Discharge Decreased caregiver support      Equipment Recommendations  Rolling walker with 5" wheels    Recommendations for Other Services     Frequency Min 3X/week    Precautions / Restrictions     Pertinent Vitals/Pain C/o pain in stomach and nausea      Mobility  Transfers Transfers: Sit to Stand;Stand to Sit Sit to Stand: 5: Supervision Stand to Sit: With upper extremity assist;With armrests Details for Transfer Assistance: pt assisted to bathroom for commode and was observed to strain and valsalva.  pt reminded not to Ambulation/Gait Ambulation/Gait Assistance: 4: Min assist Assistive device: Rolling walker Ambulation/Gait Assistance Details: pt needs some assist to control walker Gait Pattern: Step-to pattern;Narrow base of support Gait velocity: decreased General Gait Details: some ataxia in gait noted.  Pt states he has stomach pain and nausea and just doesn't feel like walking Stairs: No Wheelchair Mobility Wheelchair Mobility: No    Exercises     PT Diagnosis: Difficulty walking;Abnormality of gait;Generalized weakness  PT Problem  List: Decreased strength;Decreased activity tolerance;Decreased balance;Decreased knowledge of use of DME;Decreased safety awareness PT Treatment Interventions: DME instruction;Functional mobility training;Stair training;Gait training;Therapeutic activities;Therapeutic exercise   PT Goals Acute Rehab PT Goals PT Goal Formulation: With patient/family Time For Goal Achievement: 03/17/12 Potential to Achieve Goals: Good Pt will go Supine/Side to Sit: Independently PT Goal: Supine/Side to Sit - Progress: Goal set today Pt will go Sit to Supine/Side: Independently PT Goal: Sit to Supine/Side - Progress: Goal set today Pt will go Sit to Stand: Independently PT Goal: Sit to Stand - Progress: Goal set today Pt will go Stand to Sit: with modified independence PT Goal: Stand to Sit - Progress: Goal set today Pt will Ambulate: >150 feet;with modified independence;with least restrictive assistive device PT Goal: Ambulate - Progress: Goal set today Pt will Go Up / Down Stairs: 3-5 stairs;with min assist;with least restrictive assistive device PT Goal: Up/Down Stairs - Progress: Goal set today Pt will Perform Home Exercise Program: Independently PT Goal: Perform Home Exercise Program - Progress: Goal set today  Visit Information  Last PT Received On: 03/03/12 Assistance Needed: +1    Subjective Data  Subjective: "If I can move my bowels I will feel better" Patient Stated Goal: to have a bowel movement   Prior Functioning  Home Living Lives With: Alone Available Help at Discharge: Family;Other (Comment) (daughter lives next door) Type of Home: Mobile home Home Access: Stairs to enter Entrance Stairs-Number of Steps: 3 Home Adaptive Equipment: Straight cane (lift chair) Prior Function Level of Independence: Independent with assistive device(s) Able to Take Stairs?: Yes Communication Communication: No difficulties    Cognition  Cognition Overall Cognitive Status: Appears within  functional limits for tasks assessed/performed Arousal/Alertness: Awake/alert Orientation Level: Appears intact for tasks assessed Behavior During Session: [facility] for tasks performed Cognition - Other Comments: pt states he just doesn't feel good ...stomach pain and nausea    Extremity/Trunk Assessment Right Lower Extremity Assessment RLE ROM/Strength/Tone: WFL for tasks assessed Left Lower Extremity Assessment LLE ROM/Strength/Tone: WFL for tasks assessed   Balance Balance Balance Assessed: No  End of Session PT - End of Session Equipment Utilized During Treatment: Gait belt Activity Tolerance: Patient limited by pain Patient left: in chair;with family/visitor present Nurse Communication: Mobility status  GP Functional Assessment Tool Used: clinical judgement Functional Limitation: Mobility: Walking and moving around Mobility: Walking and Moving Around Current Status [phone]): At least 20 percent but less than 40 percent impaired, limited or restricted Mobility: Walking and Moving Around Goal Status [phone]): At least 1 percent but less than 20 percent impaired, limited or restricted  Rosey Bath K. Manson Passey, [place] [phone] 03/03/2012, 12:50 PM

## 2012-03-03 NOTE — Evaluation (Signed)
Occupational Therapy Evaluation Patient Details Name: Roberto Griffin MRN: [id number] DOB: December 24, 1924 Today's Date: 03/03/2012 Time: 4098-1191 OT Time Calculation (min): 39 min  OT Assessment / Plan / Recommendation Clinical Impression  This 79 yo male admitted weakness and dizziness, found to have possible PNA and a cystic hygroma presents to acute OT with problems below. Will benefit from acute OT with follow up at SNF.    OT Assessment  Patient needs continued OT Services    Follow Up Recommendations  SNF    Barriers to Discharge None    Equipment Recommendations  None recommended by OT       Frequency  Min 2X/week    Precautions / Restrictions Precautions Precautions: Fall   Pertinent Vitals/Pain DoE; however sats remained in low to mid 90's on RA    ADL  Eating/Feeding: Simulated;Independent Where Assessed - Eating/Feeding: Edge of bed Grooming: Simulated;Set up;Supervision/safety Where Assessed - Grooming: Unsupported sitting Upper Body Bathing: Simulated;Set up;Supervision/safety Where Assessed - Upper Body Bathing: Unsupported sitting Lower Body Bathing: Simulated;Moderate assistance Where Assessed - Lower Body Bathing: Unsupported sit to stand Upper Body Dressing: Simulated;Set up;Supervision/safety Where Assessed - Upper Body Dressing: Unsupported sitting Lower Body Dressing: Simulated;Moderate assistance Where Assessed - Lower Body Dressing: Unsupported sit to stand Toilet Transfer: Performed;Minimal assistance Toilet Transfer Method: Sit to stand Toilet Transfer Equipment: Regular height toilet;Grab bars Toileting - Clothing Manipulation and Hygiene: Simulated;Moderate assistance Where Assessed - Toileting Clothing Manipulation and Hygiene: Standing Equipment Used: Rolling walker;Gait belt Transfers/Ambulation Related to ADLs: Min A for all    OT Diagnosis: Generalized weakness  OT Problem List: Decreased strength;Decreased activity tolerance;Impaired  balance (sitting and/or standing);Cardiopulmonary status limiting activity OT Treatment Interventions: Self-care/ADL training;Energy conservation;DME and/or AE instruction;Patient/family education;Balance training   OT Goals Acute Rehab OT Goals OT Goal Formulation: With patient Time For Goal Achievement: 03/10/12 Potential to Achieve Goals: Good ADL Goals Pt Will Perform Grooming: with set-up;with supervision;Unsupported;Standing at sink ADL Goal: Grooming - Progress: Goal set today Pt Will Perform Lower Body Bathing: with set-up;with supervision;Sit to stand from chair;Sit to stand from bed;Unsupported;with adaptive equipment ADL Goal: Lower Body Bathing - Progress: Goal set today Pt Will Perform Lower Body Dressing: with set-up;with supervision;Sit to stand from bed;Sit to stand from chair;Unsupported;with adaptive equipment ADL Goal: Lower Body Dressing - Progress: Goal set today Pt Will Transfer to Toilet: with supervision;Regular height toilet;Grab bars ADL Goal: Toilet Transfer - Progress: Goal set today Pt Will Perform Toileting - Clothing Manipulation: Independently;Standing ADL Goal: Toileting - Clothing Manipulation - Progress: Goal set today Pt Will Perform Toileting - Hygiene: Independently;Sit to stand from 3-in-1/toilet ADL Goal: Toileting - Hygiene - Progress: Goal set today Miscellaneous OT Goals Miscellaneous OT Goal #1: Pt will be Mod I up from and down into bed OT Goal: Miscellaneous Goal #1 - Progress: Goal set today Miscellaneous OT Goal #2: Pt will be aware of energy conservation techniques from handout OT Goal: Miscellaneous Goal #2 - Progress: Goal set today  Visit Information  Last OT Received On: 03/03/12 Assistance Needed: +1 PT/OT Co-Evaluation/Treatment: Yes (partial)    Subjective Data  Subjective: My stomach does not feel right, I think I would feel better if I could have a bowel movement   Prior Functioning     Home Living Lives With:  Alone Available Help at Discharge: Family;Available 24 hours/day (short term) Type of Home: Mobile home Home Access: Stairs to enter Entrance Stairs-Number of Steps: 3 Entrance Stairs-Rails: Can reach both Home Layout: Two level Alternate Level  Stairs-Number of Steps: 1 Alternate Level Stairs-Rails: None Bathroom Shower/Tub: Walk-in Contractor: Standard (sink beside that he uses to help him get up and down) Bathroom Accessibility: Yes How Accessible: Accessible via walker Home Adaptive Equipment: Shower chair without back;Straight cane Prior Function Level of Independence: Independent with assistive device(s) (SPC) Able to Take Stairs?: Yes Driving: No Vocation: Retired Musician: No difficulties Dominant Hand: Right         Vision/Perception Vision - History Visual History: Macular degeneration (right eye, left eye messed up from laser sx) Patient Visual Report: No change from baseline   Cognition  Cognition Overall Cognitive Status: Appears within functional limits for tasks assessed/performed Arousal/Alertness: Awake/alert Orientation Level: Appears intact for tasks assessed Behavior During Session: [facility] for tasks performed Cognition - Other Comments: pt feels and looks better this afternoon    Extremity/Trunk Assessment Right Upper Extremity Assessment RUE ROM/Strength/Tone: Within functional levels Left Upper Extremity Assessment LUE ROM/Strength/Tone: Within functional levels Right Lower Extremity Assessment RLE ROM/Strength/Tone: WFL for tasks assessed Left Lower Extremity Assessment LLE ROM/Strength/Tone: WFL for tasks assessed     Mobility Bed Mobility Bed Mobility: Rolling Right;Right Sidelying to Sit Rolling Right: 5: Supervision Right Sidelying to Sit: 5: Supervision;HOB flat Transfers Transfers: Sit to Stand;Stand to Sit Sit to Stand: From bed;With upper extremity assist;4: Min assist Stand to Sit: 4: Min  assist;With armrests;With upper extremity assist;To chair/3-in-1 Details for Transfer Assistance: pt better able to stand from chair this afternoon           End of Session OT - End of Session Equipment Utilized During Treatment: Gait belt (RW) Activity Tolerance: Patient limited by fatigue Patient left: in chair;with call bell/phone within reach;with family/visitor present Nurse Communication:  (Needs something for his bowels)  GO Functional Assessment Tool Used: Clinical assessment Functional Limitation: Self care Self Care Current Status ([id number]): At least 40 percent but less than 60 percent impaired, limited or restricted Self Care Goal Status ([id number]): At least 1 percent but less than 20 percent impaired, limited or restricted   Evette Georges [phone] 03/03/2012, 2:33 PM

## 2012-03-03 NOTE — Progress Notes (Signed)
PGY-1 Daily Progress Note [facility] Teaching Service [name], DO Service Pager: [phone]   Subjective: Pt feeling much better today.  Would like to go home. Denies weakness.   Objective:  VITALS Temp:  [97.6 F (36.4 C)-97.8 F (36.6 C)] 97.8 F (36.6 C) (02/21 0518) Pulse Rate:  [50-79] 69 (02/21 0518) Resp:  [14-21] 18 (02/21 0518) BP: (149-179)/(76-94) 149/78 mmHg (02/21 0518) SpO2:  [93 %-100 %] 97 % (02/21 0518) Weight:  [190 lb (86.183 kg)-211 lb 14.4 oz (96.117 kg)] 211 lb 14.4 oz (96.117 kg) (02/20 2114)  In/Out  Intake/Output Summary (Last 24 hours) at 03/03/12 0850 Last data filed at 03/03/12 0700  Gross per 24 hour  Intake 1006.67 ml  Output   1325 ml  Net -318.33 ml    Physical Exam: Gen:  NAD HEENT: moist mucous membranes CV: RRR, no murmurs rubs or gallops PULM: clear to auscultation bilaterally. No wheezes/rales/rhonchi ABD: soft/nontender/nondistended/normal bowel sounds EXT: No edema Neuro: Alert and oriented x4, no aphasia, grossly normal 5/5 both U and LE MS B/L, Romberg -  MEDS Scheduled Meds: . amLODipine  10 mg Oral Daily  . azithromycin  500 mg Oral Daily  . cefTRIAXone (ROCEPHIN)  IV  1 g Intravenous Q24H  . dipyridamole  50 mg Oral BID  . glyBURIDE  3.75 mg Oral Q breakfast  . heparin  5,000 Units Subcutaneous Q8H  . insulin aspart  0-15 Units Subcutaneous TID WC  . insulin aspart  0-5 Units Subcutaneous QHS  . metFORMIN  250 mg Oral Q breakfast  . pantoprazole  40 mg Oral Daily  . sodium chloride  500 mL Intravenous Once  . sodium chloride  500 mL Intravenous Once  . sodium chloride  3 mL Intravenous Q12H   Continuous Infusions: . sodium chloride 100 mL/hr at 03/02/12 2350   PRN Meds:.acetaminophen, acetaminophen, hydrALAZINE, ondansetron (ZOFRAN) IV, ondansetron  Labs and imaging:   CBC  Recent Labs Lab 03/02/12 1533 03/02/12 1813 03/03/12 0520  WBC 11.1* 10.3 10.4  HGB 15.4 15.3 15.0  HCT 44.7 43.6 42.9   PLT 211 234 224   BMET/CMET  Recent Labs Lab 03/02/12 1445 03/02/12 1813 03/03/12 0520  NA 134*  --  138  K 4.8  --  3.5  CL 100  --  103  CO2 23  --  27  BUN 18  --  13  CREATININE 0.79 0.81 0.73  CALCIUM 9.2  --  8.9  PROT 7.2  --   --   BILITOT 0.2*  --   --   ALKPHOS 82  --   --   ALT 21  --   --   AST 21  --   --   GLUCOSE 289*  --  190*   Results for orders placed during the hospital encounter of 03/02/12 (from the past 24 hour(s))  GLUCOSE, CAPILLARY     Status: Abnormal   Collection Time    03/02/12  2:01 PM      Result Value Range   Glucose-Capillary 259 (*) 70 - 99 mg/dL  URINALYSIS, ROUTINE W REFLEX MICROSCOPIC     Status: Abnormal   Collection Time    03/02/12  2:40 PM      Result Value Range   Color, Urine YELLOW  YELLOW   APPearance CLEAR  CLEAR   Specific Gravity, Urine 1.024  1.005 - 1.030   pH 7.0  5.0 - 8.0   Glucose, UA >1000 (*) NEGATIVE mg/dL  Hgb urine dipstick NEGATIVE  NEGATIVE   Bilirubin Urine NEGATIVE  NEGATIVE   Ketones, ur NEGATIVE  NEGATIVE mg/dL   Protein, ur NEGATIVE  NEGATIVE mg/dL   Urobilinogen, UA 0.2  0.0 - 1.0 mg/dL   Nitrite NEGATIVE  NEGATIVE   Leukocytes, UA NEGATIVE  NEGATIVE  URINE MICROSCOPIC-ADD ON     Status: None   Collection Time    03/02/12  2:40 PM      Result Value Range   Squamous Epithelial / LPF RARE  RARE   WBC, UA 0-2  <3 WBC/hpf   RBC / HPF 0-2  <3 RBC/hpf   Bacteria, UA RARE  RARE  COMPREHENSIVE METABOLIC PANEL     Status: Abnormal   Collection Time    03/02/12  2:45 PM      Result Value Range   Sodium 134 (*) 135 - 145 mEq/L   Potassium 4.8  3.5 - 5.1 mEq/L   Chloride 100  96 - 112 mEq/L   CO2 23  19 - 32 mEq/L   Glucose, Bld 289 (*) 70 - 99 mg/dL   BUN 18  6 - 23 mg/dL   Creatinine, Ser 4.09  0.50 - 1.35 mg/dL   Calcium 9.2  8.4 - 81.1 mg/dL   Total Protein 7.2  6.0 - 8.3 g/dL   Albumin 3.3 (*) 3.5 - 5.2 g/dL   AST 21  0 - 37 U/L   ALT 21  0 - 53 U/L   Alkaline Phosphatase 82  39 - 117  U/L   Total Bilirubin 0.2 (*) 0.3 - 1.2 mg/dL   GFR calc non Af Amer 80 (*) >90 mL/min   GFR calc Af Amer >90  >90 mL/min  POCT I-STAT TROPONIN I     Status: None   Collection Time    03/02/12  3:05 PM      Result Value Range   Troponin i, poc 0.02  0.00 - 0.08 ng/mL   Comment 3           CBC WITH DIFFERENTIAL     Status: Abnormal   Collection Time    03/02/12  3:33 PM      Result Value Range   WBC 11.1 (*) 4.0 - 10.5 K/uL   RBC 5.31  4.22 - 5.81 MIL/uL   Hemoglobin 15.4  13.0 - 17.0 g/dL   HCT 91.4  78.2 - 95.6 %   MCV 84.2  78.0 - 100.0 fL   MCH 29.0  26.0 - 34.0 pg   MCHC 34.5  30.0 - 36.0 g/dL   RDW 21.3  08.6 - 57.8 %   Platelets 211  150 - 400 K/uL   Neutrophils Relative 78 (*) 43 - 77 %   Neutro Abs 8.7 (*) 1.7 - 7.7 K/uL   Lymphocytes Relative 16  12 - 46 %   Lymphs Abs 1.8  0.7 - 4.0 K/uL   Monocytes Relative 6  3 - 12 %   Monocytes Absolute 0.7  0.1 - 1.0 K/uL   Eosinophils Relative 0  0 - 5 %   Eosinophils Absolute 0.0  0.0 - 0.7 K/uL   Basophils Relative 0  0 - 1 %   Basophils Absolute 0.0  0.0 - 0.1 K/uL  CBC     Status: None   Collection Time    03/02/12  6:13 PM      Result Value Range   WBC 10.3  4.0 - 10.5 K/uL   RBC 5.19  4.22 - 5.81 MIL/uL   Hemoglobin 15.3  13.0 - 17.0 g/dL   HCT 40.9  81.1 - 91.4 %   MCV 84.0  78.0 - 100.0 fL   MCH 29.5  26.0 - 34.0 pg   MCHC 35.1  30.0 - 36.0 g/dL   RDW 78.2  95.6 - 21.3 %   Platelets 234  150 - 400 K/uL  CREATININE, SERUM     Status: Abnormal   Collection Time    03/02/12  6:13 PM      Result Value Range   Creatinine, Ser 0.81  0.50 - 1.35 mg/dL   GFR calc non Af Amer 79 (*) >90 mL/min   GFR calc Af Amer >90  >90 mL/min  PRO B NATRIURETIC PEPTIDE     Status: None   Collection Time    03/02/12  6:13 PM      Result Value Range   Pro B Natriuretic peptide (BNP) 110.9  0 - 450 pg/mL  TROPONIN I     Status: None   Collection Time    03/02/12  6:13 PM      Result Value Range   Troponin I <0.30  <0.30  ng/mL  HEMOGLOBIN A1C     Status: Abnormal   Collection Time    03/02/12  6:13 PM      Result Value Range   Hemoglobin A1C 9.8 (*) <5.7 %   Mean Plasma Glucose 235 (*) <117 mg/dL  GLUCOSE, CAPILLARY     Status: Abnormal   Collection Time    03/02/12  8:09 PM      Result Value Range   Glucose-Capillary 256 (*) 70 - 99 mg/dL  GLUCOSE, CAPILLARY     Status: Abnormal   Collection Time    03/02/12  8:53 PM      Result Value Range   Glucose-Capillary 290 (*) 70 - 99 mg/dL  BASIC METABOLIC PANEL     Status: Abnormal   Collection Time    03/03/12  5:20 AM      Result Value Range   Sodium 138  135 - 145 mEq/L   Potassium 3.5  3.5 - 5.1 mEq/L   Chloride 103  96 - 112 mEq/L   CO2 27  19 - 32 mEq/L   Glucose, Bld 190 (*) 70 - 99 mg/dL   BUN 13  6 - 23 mg/dL   Creatinine, Ser 0.86  0.50 - 1.35 mg/dL   Calcium 8.9  8.4 - 57.8 mg/dL   GFR calc non Af Amer 82 (*) >90 mL/min   GFR calc Af Amer >90  >90 mL/min  CBC     Status: None   Collection Time    03/03/12  5:20 AM      Result Value Range   WBC 10.4  4.0 - 10.5 K/uL   RBC 5.19  4.22 - 5.81 MIL/uL   Hemoglobin 15.0  13.0 - 17.0 g/dL   HCT 46.9  62.9 - 52.8 %   MCV 82.7  78.0 - 100.0 fL   MCH 28.9  26.0 - 34.0 pg   MCHC 35.0  30.0 - 36.0 g/dL   RDW 41.3  24.4 - 01.0 %   Platelets 224  150 - 400 K/uL  GLUCOSE, CAPILLARY     Status: Abnormal   Collection Time    03/03/12  7:33 AM      Result Value Range   Glucose-Capillary 170 (*) 70 - 99 mg/dL   Comment 1 Notify RN  X-ray Chest Pa And Lateral   03/02/2012 IMPRESSION: Small area of infiltrate and atelectasis at the left lung base. Bronchitic changes.   Original Report Authenticated By: Francene Boyers, M.D.    [name] Contrast  03/02/2012  IMPRESSION: Advanced atrophy and chronic ischemic changes.  No acute infarct.  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   Original Report Authenticated By: [name], M.D.     CT Scan 03/03/12   IMPRESSION:  Stable brain. Prominent left subdural CSF hygroma with trace  rightward midline shift. Advanced chronic small vessel ischemia.     Assessment  This is an 79 year old Caucasian male with a history of diabetes mellitus, hypertension, and past medical history of stroke who presents with weakness.    Plan:  1.Weakness/Dizziness/Lightheadedness secondary to Vasovagal event vs TIA vs CSF hygroma.  Pt was worked up for TIA/CVA and was negative for acute infarct but did show CSF hygroma on MRI/MRA  1) Consult to Neurosurgery in accordance to the CSF hygroma.  Greatly appreciate recommendations and do not believe this event is not related to his hygroma.    2) CT scan this AM to r/o SDH as a cause of his Sx.  3) Continue W/U including f/u on UCx, TSH.  A1C at 9.6, Troponin negative x 1 (will not get further), BNP 110.  BMP WNL this AM, CBC trending down from 11.1 to 10.4   4) PT/OT to evaluate  5) Will need neurosurgery eval in the outpatient setting most likely.   6) If this is vasovagal pre-syncope, will adjust medications and advise to stay hydrated and to change position lightly.   2. CAP with leukocytosis on admission   1) CBC trending down, has been afebrile, no tachycardia.   2) Switch to Oral Azithromycin vs Levaquin today  3)  3. GERD - Continue home PPI 4. DM II, uncontrolled - Pt with A1C of 9.8 on admission  1) Will need adjustment in the outpatient setting  2) Continue CBG/SSI and restart Glipizide today.  Metformin given this AM too, if does not go home today may consider d/c this in case pt gets contrast for study.  5. HTN - Pt BP elevated to 150 systolic  1) Continue Norvasc 6. Hx of CVA - On Persantine  1) Consider switch to Plavix on D/C or outpatient setting FEN/GI: NS 100 cc/hr Prophylaxis:  Hep SQ Disposition: Pending PT/OT and neurosurgery, may be able to go home today.  Code Status:Full   Roberto Griffin. Paulina Fusi, DO of [facility] 03/03/2012, 9:56  AM

## 2012-03-03 NOTE — Consult Note (Signed)
Reason for Consult: Weakness with Ventricular Bigeminy Referring Physician: Dr. Mauricio Po   HPI: Roberto Griffin is an 79 y.o. male, who we have been ask to see in consult. He is endorsing new onset of generalized weakness, in the setting of ventricular bigeminy on telemetry.  He has no past cardiac history.  Past history includes prior CVA, 23 years ago, with no residual deficits, DM and HTN. He presented to [facility] on 2/20 with a complaint of generalized weakness for the past 3-4 days. A CT of the head was done in the ER and he ruled out for TIA/CVA. He reports that before now he has been in is normal state of health. He first noticed the weakness several mornings ago after using the bathroom. He states that he found it very hard to move and to get going. He denies straining while using the restroom. No associated dizziness, syncope/presyncope or diaphoresis. He endorses mild SOB, but reports that this is a chronic issue. He denies orthopnea, PND, chest pain, palpitations, LEE. He's had nausea but no vomiting. No recent illnesses or sick contacts. No fever, chills, hematochezia, melana or hematuria. The patient reports that he still feels week but has notice some improvement over the last several days.   Past Medical History  Diagnosis Date  . Diabetes mellitus without complication   . Hypertension     Past Surgical History  Procedure Laterality Date  . Cholecystectomy      No family history on file.  Social History:  reports that he has never smoked. He does not have any smokeless tobacco history on file. He reports that he does not drink alcohol or use illicit drugs.  Allergies: No Known Allergies  Medications:  Prior to Admission:  Prescriptions prior to admission  Medication Sig Dispense Refill  . amLODipine (NORVASC) 10 MG tablet Take 10 mg by mouth daily.      Marland Kitchen dipyridamole (PERSANTINE) 50 MG tablet Take 50 mg by mouth 2 (two) times daily.      Marland Kitchen glyBURIDE (DIABETA) 2.5 MG tablet Take  3.75 mg by mouth daily with breakfast.      . metFORMIN (GLUCOPHAGE) 500 MG tablet Take 250 mg by mouth daily.      . Multiple Vitamin (MULTIVITAMIN WITH MINERALS) TABS Take 1 tablet by mouth daily.      Marland Kitchen omeprazole (PRILOSEC) 20 MG capsule Take 20 mg by mouth daily.        Results for orders placed during the hospital encounter of 03/02/12 (from the past 48 hour(s))  GLUCOSE, CAPILLARY     Status: Abnormal   Collection Time    03/02/12  2:01 PM      Result Value Range   Glucose-Capillary 259 (*) 70 - 99 mg/dL  URINALYSIS, ROUTINE W REFLEX MICROSCOPIC     Status: Abnormal   Collection Time    03/02/12  2:40 PM      Result Value Range   Color, Urine YELLOW  YELLOW   APPearance CLEAR  CLEAR   Specific Gravity, Urine 1.024  1.005 - 1.030   pH 7.0  5.0 - 8.0   Glucose, UA >1000 (*) NEGATIVE mg/dL   Hgb urine dipstick NEGATIVE  NEGATIVE   Bilirubin Urine NEGATIVE  NEGATIVE   Ketones, ur NEGATIVE  NEGATIVE mg/dL   Protein, ur NEGATIVE  NEGATIVE mg/dL   Urobilinogen, UA 0.2  0.0 - 1.0 mg/dL   Nitrite NEGATIVE  NEGATIVE   Leukocytes, UA NEGATIVE  NEGATIVE  URINE MICROSCOPIC-ADD ON  Status: None   Collection Time    03/02/12  2:40 PM      Result Value Range   Squamous Epithelial / LPF RARE  RARE   WBC, UA 0-2  <3 WBC/hpf   RBC / HPF 0-2  <3 RBC/hpf   Bacteria, UA RARE  RARE  COMPREHENSIVE METABOLIC PANEL     Status: Abnormal   Collection Time    03/02/12  2:45 PM      Result Value Range   Sodium 134 (*) 135 - 145 mEq/L   Potassium 4.8  3.5 - 5.1 mEq/L   Chloride 100  96 - 112 mEq/L   CO2 23  19 - 32 mEq/L   Glucose, Bld 289 (*) 70 - 99 mg/dL   BUN 18  6 - 23 mg/dL   Creatinine, Ser 4.54  0.50 - 1.35 mg/dL   Calcium 9.2  8.4 - 09.8 mg/dL   Total Protein 7.2  6.0 - 8.3 g/dL   Albumin 3.3 (*) 3.5 - 5.2 g/dL   AST 21  0 - 37 U/L   ALT 21  0 - 53 U/L   Alkaline Phosphatase 82  39 - 117 U/L   Total Bilirubin 0.2 (*) 0.3 - 1.2 mg/dL   GFR calc non Af Amer 80 (*) >90 mL/min    GFR calc Af Amer >90  >90 mL/min   Comment:            The eGFR has been calculated     using the CKD EPI equation.     This calculation has not been     validated in all clinical     situations.     eGFR's persistently     <90 mL/min signify     possible Chronic Kidney Disease.  POCT I-STAT TROPONIN I     Status: None   Collection Time    03/02/12  3:05 PM      Result Value Range   Troponin i, poc 0.02  0.00 - 0.08 ng/mL   Comment 3            Comment: Due to the release kinetics of cTnI,     a negative result within the first hours     of the onset of symptoms does not rule out     myocardial infarction with certainty.     If myocardial infarction is still suspected,     repeat the test at appropriate intervals.  CBC WITH DIFFERENTIAL     Status: Abnormal   Collection Time    03/02/12  3:33 PM      Result Value Range   WBC 11.1 (*) 4.0 - 10.5 K/uL   RBC 5.31  4.22 - 5.81 MIL/uL   Hemoglobin 15.4  13.0 - 17.0 g/dL   HCT 11.9  14.7 - 82.9 %   MCV 84.2  78.0 - 100.0 fL   MCH 29.0  26.0 - 34.0 pg   MCHC 34.5  30.0 - 36.0 g/dL   RDW 56.2  13.0 - 86.5 %   Platelets 211  150 - 400 K/uL   Neutrophils Relative 78 (*) 43 - 77 %   Neutro Abs 8.7 (*) 1.7 - 7.7 K/uL   Lymphocytes Relative 16  12 - 46 %   Lymphs Abs 1.8  0.7 - 4.0 K/uL   Monocytes Relative 6  3 - 12 %   Monocytes Absolute 0.7  0.1 - 1.0 K/uL   Eosinophils Relative 0  0 -  5 %   Eosinophils Absolute 0.0  0.0 - 0.7 K/uL   Basophils Relative 0  0 - 1 %   Basophils Absolute 0.0  0.0 - 0.1 K/uL  CBC     Status: None   Collection Time    03/02/12  6:13 PM      Result Value Range   WBC 10.3  4.0 - 10.5 K/uL   RBC 5.19  4.22 - 5.81 MIL/uL   Hemoglobin 15.3  13.0 - 17.0 g/dL   HCT 16.1  09.6 - 04.5 %   MCV 84.0  78.0 - 100.0 fL   MCH 29.5  26.0 - 34.0 pg   MCHC 35.1  30.0 - 36.0 g/dL   RDW 40.9  81.1 - 91.4 %   Platelets 234  150 - 400 K/uL  CREATININE, SERUM     Status: Abnormal   Collection Time    03/02/12   6:13 PM      Result Value Range   Creatinine, Ser 0.81  0.50 - 1.35 mg/dL   GFR calc non Af Amer 79 (*) >90 mL/min   GFR calc Af Amer >90  >90 mL/min   Comment:            The eGFR has been calculated     using the CKD EPI equation.     This calculation has not been     validated in all clinical     situations.     eGFR's persistently     <90 mL/min signify     possible Chronic Kidney Disease.  PRO B NATRIURETIC PEPTIDE     Status: None   Collection Time    03/02/12  6:13 PM      Result Value Range   Pro B Natriuretic peptide (BNP) 110.9  0 - 450 pg/mL  TROPONIN I     Status: None   Collection Time    03/02/12  6:13 PM      Result Value Range   Troponin I <0.30  <0.30 ng/mL   Comment:            Due to the release kinetics of cTnI,     a negative result within the first hours     of the onset of symptoms does not rule out     myocardial infarction with certainty.     If myocardial infarction is still suspected,     repeat the test at appropriate intervals.  HEMOGLOBIN A1C     Status: Abnormal   Collection Time    03/02/12  6:13 PM      Result Value Range   Hemoglobin A1C 9.8 (*) <5.7 %   Comment: (NOTE)                                                                               According to the ADA Clinical Practice Recommendations for 2011, when     HbA1c is used as a screening test:      >=6.5%   Diagnostic of Diabetes Mellitus               (if abnormal result is confirmed)     5.7-6.4%  Increased risk of developing Diabetes Mellitus     References:Diagnosis and Classification of Diabetes Mellitus,Diabetes     Care,2011,34(Suppl 1):S62-S69 and Standards of Medical Care in             Diabetes - 2011,Diabetes Care,2011,34 (Suppl 1):S11-S61.   Mean Plasma Glucose 235 (*) <117 mg/dL  GLUCOSE, CAPILLARY     Status: Abnormal   Collection Time    03/02/12  8:09 PM      Result Value Range   Glucose-Capillary 256 (*) 70 - 99 mg/dL  GLUCOSE, CAPILLARY     Status:  Abnormal   Collection Time    03/02/12  8:53 PM      Result Value Range   Glucose-Capillary 290 (*) 70 - 99 mg/dL  BASIC METABOLIC PANEL     Status: Abnormal   Collection Time    03/03/12  5:20 AM      Result Value Range   Sodium 138  135 - 145 mEq/L   Potassium 3.5  3.5 - 5.1 mEq/L   Chloride 103  96 - 112 mEq/L   CO2 27  19 - 32 mEq/L   Glucose, Bld 190 (*) 70 - 99 mg/dL   BUN 13  6 - 23 mg/dL   Creatinine, Ser 1.61  0.50 - 1.35 mg/dL   Calcium 8.9  8.4 - 09.6 mg/dL   GFR calc non Af Amer 82 (*) >90 mL/min   GFR calc Af Amer >90  >90 mL/min   Comment:            The eGFR has been calculated     using the CKD EPI equation.     This calculation has not been     validated in all clinical     situations.     eGFR's persistently     <90 mL/min signify     possible Chronic Kidney Disease.  CBC     Status: None   Collection Time    03/03/12  5:20 AM      Result Value Range   WBC 10.4  4.0 - 10.5 K/uL   RBC 5.19  4.22 - 5.81 MIL/uL   Hemoglobin 15.0  13.0 - 17.0 g/dL   HCT 04.5  40.9 - 81.1 %   MCV 82.7  78.0 - 100.0 fL   MCH 28.9  26.0 - 34.0 pg   MCHC 35.0  30.0 - 36.0 g/dL   RDW 91.4  78.2 - 95.6 %   Platelets 224  150 - 400 K/uL  GLUCOSE, CAPILLARY     Status: Abnormal   Collection Time    03/03/12  7:33 AM      Result Value Range   Glucose-Capillary 170 (*) 70 - 99 mg/dL   Comment 1 Notify RN    GLUCOSE, CAPILLARY     Status: Abnormal   Collection Time    03/03/12 10:57 AM      Result Value Range   Glucose-Capillary 244 (*) 70 - 99 mg/dL  TROPONIN I     Status: None   Collection Time    03/03/12 12:00 PM      Result Value Range   Troponin I <0.30  <0.30 ng/mL   Comment:            Due to the release kinetics of cTnI,     a negative result within the first hours     of the onset of symptoms does not rule out     myocardial infarction  with certainty.     If myocardial infarction is still suspected,     repeat the test at appropriate intervals.    Dg  Chest 2 View  03/03/2012  *RADIOLOGY REPORT*  Clinical Data: Shortness of breath.  CHEST - 2 VIEW  Comparison: Chest 03/02/2012.  Findings: Small focus airspace disease in the left lung base seen on the comparison study has resolved.  Right lung is clear.  No pneumothorax or pleural effusion.  Heart size is normal.  IMPRESSION: No acute finding.  Resolved left lower lobe atelectasis.   Original Report Authenticated By: Holley Dexter, M.D.    X-ray Chest Pa And Lateral   03/02/2012  *RADIOLOGY REPORT*  Clinical Data: Crackles in the left lower lung field.  CHEST - 2 VIEW  Comparison: None.  Findings: There is a small patchy area of density at the left lung base posteriorly with slight atelectasis.  There is prominent peribronchial thickening consistent bronchitis.  Heart size and vascularity are normal.  No effusions.  No acute osseous abnormality.  IMPRESSION: Small area of infiltrate and atelectasis at the left lung base. Bronchitic changes.   Original Report Authenticated By: Francene Boyers, M.D.    Ct Head Wo Contrast  03/03/2012  *RADIOLOGY REPORT*  Clinical Data: 79 year old male with dizziness and weakness.  Left subdural hygroma.  CT HEAD WITHOUT CONTRAST  Technique:  Contiguous axial images were obtained from the base of the skull through the vertex without contrast.  Comparison: Brain MRI 08/12/2012.  Findings: Stable CSF isointense extra-axial fluid along the left hemisphere, most prominent at the vertex (series 2 image 24). There is trace rightward midline shift, unchanged.  Basilar cisterns are patent.  No ventriculomegaly. No acute intracranial hemorrhage identified.  Patchy confluent white matter hypodensity corresponding to the abnormal MRI signal yesterday.  Chronic lacunar infarcts in the deep gray matter nuclei. No evidence of cortically based acute infarction identified.  Intracranial artery dolichoectasia. No suspicious intracranial vascular hyperdensity.  No new intracranial mass effect  or mass lesion.  Visualized paranasal sinuses and mastoids are clear.  No acute osseous abnormality identified.  Stable orbits and scalp soft tissues.  IMPRESSION: Stable brain.  Prominent left subdural CSF hygroma with trace rightward midline shift.  Advanced chronic small vessel ischemia.   Original Report Authenticated By: Erskine Speed, M.D.    [name] Contrast  03/02/2012  *RADIOLOGY REPORT*  Clinical Data: Slurred speech and memory loss.  Nausea and dizziness.  Diabetes and hypertension  MRI HEAD WITHOUT CONTRAST  Technique:  Multiplanar, multiecho pulse sequences of the brain and surrounding structures were obtained according to standard protocol without intravenous contrast.  Comparison: None  Findings: Moderate to advanced atrophy.  Marked chronic ischemic changes are present throughout the white matter bilaterally. 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 Chronic ischemia in the pons and right cerebellum.  Negative for acute infarct.  Negative for mass lesion.  CSF hygroma is present left parietal region.  This has CSF signal characteristics.  There is mild midline shift to the right of 3 mm.  IMPRESSION: Advanced atrophy and chronic ischemic changes.  No acute infarct.  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   Original Report Authenticated By: [name], M.D.     Review of Systems  Constitutional: Positive for malaise/fatigue. Negative for fever, chills and diaphoresis.  HENT: Negative for congestion and tinnitus.   Respiratory: Positive for shortness of breath. Negative for cough and  wheezing.   Cardiovascular: Positive for leg swelling. Negative for chest pain, palpitations, orthopnea, claudication and PND.  Gastrointestinal: Positive for nausea and constipation. Negative for vomiting, abdominal pain, blood in stool and melena.  Genitourinary: Negative for hematuria.  Neurological: Positive for weakness. Negative for  dizziness and loss of consciousness.   Blood pressure 139/86, pulse 88, temperature 97.6 F (36.4 C), temperature source Oral, resp. rate 18, height 6' (1.829 m), weight 211 lb 14.4 oz (96.117 kg), SpO2 95.00%. Physical Exam  Constitutional: He is oriented to person, place, and time. He appears well-developed and well-nourished. No distress.  HENT:  Head: Normocephalic and atraumatic.  Eyes: Conjunctivae are normal. Pupils are equal, round, and reactive to light.  Neck: No JVD present. No thyromegaly present.  Cardiovascular: Normal rate and regular rhythm.  Exam reveals no gallop and no friction rub.   No murmur heard. Distant heart sounds  Respiratory: Effort normal and breath sounds normal. No respiratory distress. He has no wheezes. He has no rales.  GI: Soft. Bowel sounds are normal. He exhibits no distension and no mass.  Musculoskeletal: He exhibits edema (1+ LEE).  Lymphadenopathy:    He has no cervical adenopathy.  Neurological: He is oriented to person, place, and time.  Skin: Skin is warm and dry. He is not diaphoretic.  Psychiatric: He has a normal mood and affect. His behavior is normal.    Assessment/Plan: Telemetry shows Ventricular bigeminy. K+ is low end of normal at 3.5. Will supplement with 40 mEq of K+. Will check Mg. He is not on a BB. Will consider adding to medication regimen . 2D echo has already been ordered. Will assess heart function via echo. Will monitor. MD to follow.     Griffin, Roberto 03/03/2012, 2:53 PM    Patient seen and examined. Agree with assessment and plan. Very pleasant 79 yo WNM with h/o prior CVA who presents now with episodes of presyncope/dizziness. No chest pain or known h./o CAD or LV dysfunction. Telemetry has shown occasional PVC's with transient bigeminy and NSVT with retrograde P waves post PVC beat. Will check Mg in the setting of mild hypokalemia and K replete to >4.0. Agree with echo to assess LV function. Will add low dose  beta-blocker for ectopy suppression.  Check for othostatic BP's. No sign of CHF on exam. BNP is not elevated. Needs improved glucose control with HBA1c 9.8. Will follow.   Lennette Bihari, MD, [facility] 03/03/2012 3:17 PM

## 2012-03-03 NOTE — Progress Notes (Signed)
Physical Therapy Treatment Patient Details Name: Roberto Griffin MRN: [id number] DOB: 11-19-1924 Today's Date: 03/03/2012 Time: 1335-1400 PT Time Calculation (min): 25 min  PT Assessment / Plan / Recommendation Comments on Treatment Session  Pt improved this afternoon.  He still needs RW for safety with walking and has decreased activity tolerance with subjective shortness of breath that improves with rest.  He thinks that he may be able to go home with HHPT and family feels they will be able to care for him    Follow Up Recommendations  Home health PT     Does the patient have the potential to tolerate intense rehabilitation     Barriers to Discharge Decreased caregiver support      Equipment Recommendations  Rolling walker with 5" wheels    Recommendations for Other Services    Frequency Min 3X/week   Plan Discharge plan needs to be updated;Frequency remains appropriate    Precautions / Restrictions Precautions Precautions: Fall   Pertinent Vitals/Pain Does not c/o stomach pain this afternoon    Mobility  Transfers Transfers: Sit to Stand;Stand to Sit Sit to Stand: 6: Modified independent (Device/Increase time);With armrests;With upper extremity assist Stand to Sit:  (pt does not reach back for armrests to sit even with cues) Details for Transfer Assistance: pt better able to stand from chair this afternoon Ambulation/Gait Ambulation/Gait Assistance: 4: Min assist;4: Min guard Ambulation Distance (Feet): 450 Feet (300., 150) Assistive device: Rolling walker Ambulation/Gait Assistance Details: cues to stand erect Gait Pattern: Step-to pattern;Narrow base of support;Trunk flexed Gait velocity: decreased General Gait Details: pt continues to walk with flexed posture, but was able to attempt to stand taller at times during walk pt reports he feels breathless with ambulation Stairs: No Wheelchair Mobility Wheelchair Mobility: No    Exercises Other Exercises Other  Exercises: standing gluteal sets Other Exercises: standing heel railses   PT Diagnosis: Difficulty walking;Abnormality of gait;Generalized weakness  PT Problem List: Decreased strength;Decreased activity tolerance;Decreased balance;Decreased knowledge of use of DME;Decreased safety awareness PT Treatment Interventions: DME instruction;Functional mobility training;Stair training;Gait training;Therapeutic activities;Therapeutic exercise   PT Goals Acute Rehab PT Goals PT Goal Formulation: With patient/family Time For Goal Achievement: 03/17/12 Potential to Achieve Goals: Good Pt will go Supine/Side to Sit: Independently PT Goal: Supine/Side to Sit - Progress: Goal set today Pt will go Sit to Supine/Side: Independently PT Goal: Sit to Supine/Side - Progress: Goal set today Pt will go Sit to Stand: Independently PT Goal: Sit to Stand - Progress: Progressing toward goal Pt will go Stand to Sit: with modified independence PT Goal: Stand to Sit - Progress: Progressing toward goal Pt will Ambulate: >150 feet;with modified independence;with least restrictive assistive device PT Goal: Ambulate - Progress: Progressing toward goal Pt will Go Up / Down Stairs: 3-5 stairs;with min assist;with least restrictive assistive device PT Goal: Up/Down Stairs - Progress: Goal set today Pt will Perform Home Exercise Program: Independently PT Goal: Perform Home Exercise Program - Progress: Progressing toward goal  Visit Information  Last PT Received On: 03/03/12 Assistance Needed: +1    Subjective Data  Subjective: "It takes me a while to catch my breath" Patient Stated Goal: to go home   Cognition  Cognition Overall Cognitive Status: Appears within functional limits for tasks assessed/performed Arousal/Alertness: Awake/alert Orientation Level: Appears intact for tasks assessed Behavior During Session: [facility] for tasks performed Cognition - Other Comments: pt feels and looks better this afternoon     Balance  Balance Balance Assessed: Yes Static Sitting Balance  Static Sitting - Balance Support: No upper extremity supported;Feet supported Static Standing Balance Static Standing - Balance Support: Bilateral upper extremity supported Static Standing - Level of Assistance: 6: Modified independent (Device/Increase time) Static Standing - Comment/# of Minutes: stood with walker and at sink with both hands on sink for exercise  End of Session PT - End of Session Equipment Utilized During Treatment: Gait belt Activity Tolerance: Patient tolerated treatment well Patient left: in chair;with family/visitor present Nurse Communication: Mobility status   GP Functional Assessment Tool Used: clinical judgement Functional Limitation: Mobility: Walking and moving around Mobility: Walking and Moving Around Current Status [phone]): At least 20 percent but less than 40 percent impaired, limited or restricted Mobility: Walking and Moving Around Goal Status [phone]): At least 1 percent but less than 20 percent impaired, limited or restricted   Rosey Bath K. Manson Passey, [place] [phone] 03/03/2012, 2:12 PM

## 2012-03-03 NOTE — Progress Notes (Signed)
2/21  Patient's HgbA1C is 9.8% which would indicate that his blood sugar is running greater than 212 mg/dl on an average of 3 months.  Will need to be followed up by primary care physician for readjustment of diabetes medication at discharge. Smith Mince RN BSN CDE Diabetes Coordinator

## 2012-03-04 DIAGNOSIS — R5383 Other fatigue: Secondary | ICD-10-CM

## 2012-03-04 DIAGNOSIS — I10 Essential (primary) hypertension: Secondary | ICD-10-CM

## 2012-03-04 DIAGNOSIS — R55 Syncope and collapse: Secondary | ICD-10-CM

## 2012-03-04 LAB — GLUCOSE, CAPILLARY: Glucose-Capillary: 123 mg/dL — ABNORMAL HIGH (ref 70–99)

## 2012-03-04 LAB — TROPONIN I: Troponin I: <0.3 ng/mL (ref <0.30)

## 2012-03-04 MED ORDER — CLOPIDOGREL BISULFATE 75 MG PO TABS: 75.0000 mg | ORAL_TABLET | Freq: Every day | ORAL | Status: DC

## 2012-03-04 MED ORDER — LISINOPRIL 10 MG PO TABS: 10.0000 mg | ORAL_TABLET | Freq: Every day | ORAL | Status: AC

## 2012-03-04 MED ORDER — METOPROLOL TARTRATE 25 MG PO TABS
25.0000 mg | ORAL_TABLET | Freq: Two times a day (BID) | ORAL | Status: DC
  Administered 2012-03-04: 25 mg via ORAL
  Filled 2012-03-04 (×2): qty 1

## 2012-03-04 MED ORDER — METOPROLOL TARTRATE 25 MG PO TABS: 25.0000 mg | ORAL_TABLET | Freq: Two times a day (BID) | ORAL | Status: AC

## 2012-03-04 MED ORDER — POLYETHYLENE GLYCOL 3350 17 G PO PACK
17.0000 g | PACK | ORAL | Status: DC | PRN
  Filled 2012-03-04: qty 1

## 2012-03-04 NOTE — Progress Notes (Signed)
Subjective:  Feels fine.  No sensation of palpitations, CP/SOB. No dizziness, syncope/pre-syncope.  Objective:  Vital Signs in the last 24 hours: Temp:  [97.6 F (36.4 C)-98.3 F (36.8 C)] 98 F (36.7 C) (02/22 0930) Pulse Rate:  [75-95] 95 (02/22 0934) Resp:  [18-20] 20 (02/22 0930) BP: (139-177)/(61-90) 155/90 mmHg (02/22 0934) SpO2:  [95 %-98 %] 98 % (02/22 0930) Weight:  [96.8 kg (213 lb 6.5 oz)] 96.8 kg (213 lb 6.5 oz) (02/21 2039)  Intake/Output from previous day: 02/21 0701 - 02/22 0700 In: 3140 [P.O.:840; I.V.:2300] Out: 650 [Urine:650] Intake/Output from this shift: Total I/O In: 240 [P.O.:240] Out: 400 [Urine:400]  Physical Exam: General appearance: alert, cooperative, appears stated age and no distress Neck: no adenopathy, no carotid bruit, no JVD and supple, symmetrical, trachea midline Lungs: clear to auscultation bilaterally and normal percussion bilaterally Heart: regular rate and rhythm, S1, S2 normal, no murmur, click, rub or gallop Abdomen: soft, non-tender; bowel sounds normal; no masses,  no organomegaly Extremities: edema 1+ Pulses: 2+ and symmetric Neurologic: Cranial nerves: III,IV,VI: extraocular muscles medial rectus abnormal on the right, otherwise CN grossly intact  Lab Results:  Recent Labs  03/02/12 1813 03/03/12 0520  WBC 10.3 10.4  HGB 15.3 15.0  PLT 234 224    Recent Labs  03/02/12 1445 03/02/12 1813 03/03/12 0520  NA 134*  --  138  K 4.8  --  3.5  CL 100  --  103  CO2 23  --  27  GLUCOSE 289*  --  190*  BUN 18  --  13  CREATININE 0.79 0.81 0.73    Recent Labs  03/03/12 1630 03/03/12 2355  TROPONINI <0.30 <0.30   Hepatic Function Panel  Recent Labs  03/02/12 1445  PROT 7.2  ALBUMIN 3.3*  AST 21  ALT 21  ALKPHOS 82  BILITOT 0.2*   Imaging: Imaging results have been reviewed  Cardiac Studies:  Assessment/Plan:  PVCs with Ventricular Bigeminy  Minimal PVCs overnight on Tele  Will review Echo for gross  abnormalities given PVCs.  I do not suspect this is a primary cardiac issue. Provided no significant Echo abnormalities, would not need long term Cardiology f/u.   LOS: 2 days    HARDING,DAVID W 03/04/2012, 10:07 AM

## 2012-03-04 NOTE — Progress Notes (Signed)
Patient ID: Roberto Griffin, male   DOB: 04-Jan-1925, 79 y.o.   MRN: [id number] Subjective:  The patient is alert and pleasant. He has no complaints.  Objective: Vital signs in last 24 hours: Temp:  [97.6 F (36.4 C)-98.3 F (36.8 C)] 98 F (36.7 C) (02/22 0930) Pulse Rate:  [75-95] 95 (02/22 0934) Resp:  [18-20] 20 (02/22 0930) BP: (139-177)/(61-90) 155/90 mmHg (02/22 0934) SpO2:  [95 %-98 %] 98 % (02/22 0930) Weight:  [96.8 kg (213 lb 6.5 oz)] 96.8 kg (213 lb 6.5 oz) (02/21 2039)  Intake/Output from previous day: 02/21 0701 - 02/22 0700 In: 3140 [P.O.:840; I.V.:2300] Out: 650 [Urine:650] Intake/Output this shift: Total I/O In: 240 [P.O.:240] Out: 400 [Urine:400]  Physical exam the patient is alert and oriented x3. He is moving all 4 extremities well. He has a disconjugate gaze.  I reviewed the patient's followup head CT performed yesterday at [facility] without contrast. It demonstrates a left-sided subdural hygroma with minimal mass effect.  Lab Results:  Recent Labs  03/02/12 1813 03/03/12 0520  WBC 10.3 10.4  HGB 15.3 15.0  HCT 43.6 42.9  PLT 234 224   BMET  Recent Labs  03/02/12 1445 03/02/12 1813 03/03/12 0520  NA 134*  --  138  K 4.8  --  3.5  CL 100  --  103  CO2 23  --  27  GLUCOSE 289*  --  190*  BUN 18  --  13  CREATININE 0.79 0.81 0.73  CALCIUM 9.2  --  8.9    Studies/Results: Dg Chest 2 View  03/03/2012  *RADIOLOGY REPORT*  Clinical Data: Shortness of breath.  CHEST - 2 VIEW  Comparison: Chest 03/02/2012.  Findings: Small focus airspace disease in the left lung base seen on the comparison study has resolved.  Right lung is clear.  No pneumothorax or pleural effusion.  Heart size is normal.  IMPRESSION: No acute finding.  Resolved left lower lobe atelectasis.   Original Report Authenticated By: Holley Dexter, M.D.    X-ray Chest Pa And Lateral   03/02/2012  *RADIOLOGY REPORT*  Clinical Data: Crackles in the left lower lung field.   CHEST - 2 VIEW  Comparison: None.  Findings: There is a small patchy area of density at the left lung base posteriorly with slight atelectasis.  There is prominent peribronchial thickening consistent bronchitis.  Heart size and vascularity are normal.  No effusions.  No acute osseous abnormality.  IMPRESSION: Small area of infiltrate and atelectasis at the left lung base. Bronchitic changes.   Original Report Authenticated By: Francene Boyers, M.D.    Ct Head Wo Contrast  03/03/2012  *RADIOLOGY REPORT*  Clinical Data: 79 year old male with dizziness and weakness.  Left subdural hygroma.  CT HEAD WITHOUT CONTRAST  Technique:  Contiguous axial images were obtained from the base of the skull through the vertex without contrast.  Comparison: Brain MRI 08/12/2012.  Findings: Stable CSF isointense extra-axial fluid along the left hemisphere, most prominent at the vertex (series 2 image 24). There is trace rightward midline shift, unchanged.  Basilar cisterns are patent.  No ventriculomegaly. No acute intracranial hemorrhage identified.  Patchy confluent white matter hypodensity corresponding to the abnormal MRI signal yesterday.  Chronic lacunar infarcts in the deep gray matter nuclei. No evidence of cortically based acute infarction identified.  Intracranial artery dolichoectasia. No suspicious intracranial vascular hyperdensity.  No new intracranial mass effect or mass lesion.  Visualized paranasal sinuses and mastoids are clear.  No acute  osseous abnormality identified.  Stable orbits and scalp soft tissues.  IMPRESSION: Stable brain.  Prominent left subdural CSF hygroma with trace rightward midline shift.  Advanced chronic small vessel ischemia.   Original Report Authenticated By: Erskine Speed, M.D.    [name] Contrast  03/02/2012  *RADIOLOGY REPORT*  Clinical Data: Slurred speech and memory loss.  Nausea and dizziness.  Diabetes and hypertension  MRI HEAD WITHOUT CONTRAST  Technique:  Multiplanar, multiecho  pulse sequences of the brain and surrounding structures were obtained according to standard protocol without intravenous contrast.  Comparison: None  Findings: Moderate to advanced atrophy.  Marked chronic ischemic changes are present throughout the white matter bilaterally. 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 Chronic ischemia in the pons and right cerebellum.  Negative for acute infarct.  Negative for mass lesion.  CSF hygroma is present left parietal region.  This has CSF signal characteristics.  There is mild midline shift to the right of 3 mm.  IMPRESSION: Advanced atrophy and chronic ischemic changes.  No acute infarct.  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   Original Report Authenticated By: [name], M.D.     Assessment/Plan: Left subdural hygroma: This requires no intervention. The patient to followup with Dr. Yetta Barre in the outpatient setting in a month or 2. Please call if we can be of further assistance. I'll sign off.  LOS: 2 days     Marquite Attwood D 03/04/2012, 9:53 AM

## 2012-03-04 NOTE — Progress Notes (Signed)
PGY-1 Daily Progress Note Family Medicine Teaching Service [place] R. Hess, DO Service Pager: [phone]   Subjective: Pt feeling okay.  Doesn't remember why he is here.  Agreeable to SNF for rehab.  Reports still having issues having BM  Objective:  VITALS Temp:  [97.6 F (36.4 C)-98.3 F (36.8 C)] 98.3 F (36.8 C) (02/22 0545) Pulse Rate:  [75-89] 75 (02/22 0545) Resp:  [18] 18 (02/22 0545) BP: (139-177)/(61-86) 154/84 mmHg (02/22 0545) SpO2:  [95 %-97 %] 96 % (02/22 0545) Weight:  [213 lb 6.5 oz (96.8 kg)] 213 lb 6.5 oz (96.8 kg) (02/21 2039)  In/Out  Intake/Output Summary (Last 24 hours) at 03/04/12 1191 Last data filed at 03/04/12 0600  Gross per 24 hour  Intake   3140 ml  Output    650 ml  Net   2490 ml    Physical Exam: Gen:  NAD HEENT: moist mucous membranes CV: RRR, no murmurs rubs or gallops PULM: clear to auscultation bilaterally. No wheezes/rales/rhonchi ABD: soft/nontender/mildly distended, normal bowel sounds EXT: 1+/4 LE edema.   Neuro: Alert and oriented x4, no aphasia, grossly normal 5/5 both U and LE MS B/L,   MEDS Scheduled Meds: . amLODipine  10 mg Oral Daily  . clopidogrel  75 mg Oral Q breakfast  . heparin  5,000 Units Subcutaneous Q8H  . insulin aspart  0-15 Units Subcutaneous TID WC  . insulin aspart  0-5 Units Subcutaneous QHS  . metoprolol tartrate  25 mg Oral BID  . pantoprazole  40 mg Oral Daily  . sodium chloride  500 mL Intravenous Once  . sodium chloride  500 mL Intravenous Once  . sodium chloride  3 mL Intravenous Q12H   Continuous Infusions: . sodium chloride 100 mL/hr at 03/04/12 0623   PRN Meds:.acetaminophen, acetaminophen, hydrALAZINE, ondansetron (ZOFRAN) IV, ondansetron, polyethylene glycol  Labs and imaging:   CBC  Recent Labs Lab 03/02/12 1533 03/02/12 1813 03/03/12 0520  WBC 11.1* 10.3 10.4  HGB 15.4 15.3 15.0  HCT 44.7 43.6 42.9  PLT 211 234 224   BMET/CMET  Recent Labs Lab 03/02/12 1445 03/02/12 1813  03/03/12 0520  NA 134*  --  138  K 4.8  --  3.5  CL 100  --  103  CO2 23  --  27  BUN 18  --  13  CREATININE 0.79 0.81 0.73  CALCIUM 9.2  --  8.9  PROT 7.2  --   --   BILITOT 0.2*  --   --   ALKPHOS 82  --   --   ALT 21  --   --   AST 21  --   --   GLUCOSE 289*  --  190*    Recent Labs Lab 03/02/12 1505  03/03/12 1200 03/03/12 1630 03/03/12 2355  TROPIPOC 0.02  --   --   --   --   TROPONINI  --   < > <0.30 <0.30 <0.30  < > = values in this interval not displayed.   03/03/2012 16:29  Magnesium 1.9    03/03/2012 09:58  TSH 1.454    03/02/2012 18:13  Pro B Natriuretic peptide (BNP) 110.9  Hemoglobin A1C 9.8 (H)   MICRO: 45,000cfu of multiple morphotypes,   IMAGING:  X-ray Chest Pa And Lateral   03/02/2012 IMPRESSION: Small area of infiltrate and atelectasis at the left lung base. Bronchitic changes.   Original Report Authenticated By: Francene Boyers, M.D.    [name] Contrast  03/02/2012  IMPRESSION: Advanced atrophy and chronic ischemic changes.  No acute infarct.  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   Original Report Authenticated By: [name], M.D.     CT Scan 03/03/12  IMPRESSION:  Stable brain. Prominent left subdural CSF hygroma with trace  rightward midline shift. Advanced chronic small vessel ischemia.    Assessment  This is an 79 year old Caucasian male with a history of diabetes mellitus, hypertension, and past medical history of stroke who presents with weakness.    Plan:  # Weakness/Dizziness/Lightheadedness - likely secondary to Vasovagal event vs TIA vs CSF hygroma.  Pt was worked up for TIA/CVA and was negative for acute infarct but did show CSF hygroma on MRI/MRA  - Neurosurgery does not feel the hydgroma is related to this event.    - Repeat CT scan negative for SDH  - A1C at 9.6, Troponin negative x 1 (will not get further), BNP 110.  BMP WNL this AM, CBC trending down from 11.1 to 10.4   - PT/OT  recommending SNF - placement pending  - Will need neurosurgery eval in the outpatient setting most likely.   - CHECK Orthostatic VS, reordered  # Ventricular Bigeminy - CARDS following, ECHO ordered, telemetry improved.  Troponin's negative  # ID: Not CAP, no UTI - ABX stopped 2/21, trend WBC and Fever curve  # GERD - Continue home PPI  # DM II, uncontrolled - Pt with A1C of 9.8 on admission  Will need adjustment in the outpatient setting  Continue CBG/SSI and restart Glipizide today.   Hold Metformin   # HTN - Pt BP elevated to 150 systolic  - Continue Norvasc  - BB started yesterday for Ventricular Bigeminy  # Hx of CVA - on persantine monotherapy (underdosed) as OP.    - Switch to Plavix   FEN/GI: NS 100 cc/hr Prophylaxis:  Hep SQ Disposition: Pending PT/OT and neurosurgery, may be able to go home today.  Code Status: Full   Andrena Mews, DO [facility] Resident - PGY-2 03/04/2012 8:42 AM

## 2012-03-04 NOTE — Progress Notes (Signed)
FMTS Attending Daily Note:  Roberto Don MD  [phone] pager  Family Practice pager:  [phone] I have seen and examined this patient and have reviewed their chart. I have discussed this patient with the resident. I agree with the resident's findings, assessment and care plan.  Additionally: - Patient much improved.  - Greatly appreciate cardiology input.   - Weakness seems to be vasovagal in nature.  Agree with a addition of low dose beta blocker for PVCs and NSVT.   - Pending no problems with Echo, patient can be DC'ed home today - there was some confusion as to whether he would be DC'ed to SNF or home with HHPT, family in room today and they state they can take care of him.  Second evaluation by PT recommended HHPT, so we will go with this.   - Needs outpt FU for DM2 with A1C 9.8

## 2012-03-04 NOTE — Progress Notes (Signed)
Patient discharge home per MD order.  Discharge instruction reviewed with patient and family.  Copies of all forms given to patient and explained. Patient/family voiced understanding of all instructions.  No acte distress noted at d/c. D/c via wheelchair with family.

## 2012-03-05 NOTE — Discharge Summary (Signed)
Physician Discharge Summary  Patient ID: Roberto Griffin MRN: [id number] DOB: Mar 09, 1924 Age: 79 y.o.  Admit date: 03/02/2012 Discharge date: 03/05/2012 Admitting Physician: Barbaraann Barthel, MD  PCP: No primary provider on file.  Consultants:Neurosurgery ( Dr. Yetta Barre), Cardiology [facility])     Discharge Diagnosis: Principal Problem:   Generalized weakness Active Problems:   DM (diabetes mellitus) type 2, uncontrolled, with ketoacidosis   HTN (hypertension)    Hospital Course Pt is an 79 year old Caucasian male with a history of diabetes mellitus, hypertension, and past medical history of stroke who presented with weakness concerning for TIA vs CVA.  1) Weakness/Dizziness/Lightheadedness secondary to vasovagal event - Pt presented to the ED with one day history of weakness and lightheadedness secondary to having a bowel movement.  He was worked up for a CVA in the ED including EKG which showed NSR, a CBC with WBC of 11.1, A1C of 9.8, a pro-BNP of 110, UA not showing infection, troponin negative x 1, and TSH of 1.3.  An MRI/MRA was performed ordered due to concern for acute stroke and did not show acute infarct but did show a CSF hygroma.  Neurosurgery was consulted at this point and they did not believe this was a possible etiology of his Sx.  They recommended a CT to evaluate for SDH, and this was negative.  PT/OT was consulted as well and they recommended SNF placement.  Pt continued to progress through his hospital stay and his family believed they could take care of him.  PT performed another evaluation and believed he could go home with home health.  This event was most likely secondary to vasovagal event and pt felt well at time of d/c.   2. Ventricular Bigeminy with Grade One Diastolic CHF - Pt was placed on telemetry on admission and had a repeat EKG on his second hospital day.  He was found to have ventricular bigeminy and cardiology was consulted for further evaluation and possible  causes of his weakness/dizziness.  They recommended keeping the patient on telemetry and performing an ECHO.  Pt's ECHO showed EF 40-45% with a grade one diastolic dysfunction.  Cardiology did not feel his Sx were secondary to cardiac origin and pt did not require long term follow up, but he was started on Metoprolol 25 mg BID.    3. DM II, uncontrolled - Pt with Hx of DM II on 250 mg Metformin qd and Glipizide.  Pt had A1C showing 9.8 and he was placed on SSI and CBG checks.  His sugars were elevated to the 250-300s for most of his stay and required around 17 U novolog a day.  His Metformin and Glipizide were restarted on discharge.  4. Atelectasis on CXR concerning for PNA - Pt found to have leukocytosis to 11.1 on admission, which trended down during his hospitalization.  A CXR done also showed possible LLL infiltrate and he was started on Rocephin and Azithro.  Pt did not show S/Sx of infection and a repeat CXR showed resolution.  His ABx were stopped and he did well without fever, chills, tachycardia prior to d/c.  5. History of CVA - Pt on Persantine monotherapy on admission.  He was switched to Plavix while in the hospital and did well.   6. GERD - Stable, continued home meds.   7. HTN - BP stable during hospital stay, continued on Norvasc and started on BB for his Ventricular Bigeminy.         Discharge PE   Filed  Vitals:   03/04/12 1313  BP: 161/81  Pulse: 88  Temp:   Resp: 18   Gen: NAD  HEENT: moist mucous membranes  CV: RRR, no murmurs rubs or gallops  PULM: clear to auscultation bilaterally. No wheezes/rales/rhonchi  ABD: soft/nontender/mildly distended, normal bowel sounds  EXT: Trace LE Edema B/L  Neuro: Alert and oriented x4, no aphasia, grossly normal 5/5 both U and LE MS B/L      Procedures/Imaging:  Dg Chest 2 View  03/03/2012  IMPRESSION: No acute finding.  Resolved left lower lobe atelectasis.   Original Report Authenticated By: Holley Dexter, M.D.    Ct  Head Wo Contrast  03/03/2012    IMPRESSION: Stable brain.  Prominent left subdural CSF hygroma with trace rightward midline shift.  Advanced chronic small vessel ischemia.   Original Report Authenticated By: Erskine Speed, M.D.    [name] Contrast  03/02/2012    IMPRESSION: Advanced atrophy and chronic ischemic changes.  No acute infarct.  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   Original Report Authenticated By: [name], M.D.    03/03/12  ECHO - EF 40-45%.  Grade One diastolic CHF.     Labs  CBC  Recent Labs Lab 03/02/12 1533 03/02/12 1813 03/03/12 0520  WBC 11.1* 10.3 10.4  HGB 15.4 15.3 15.0  HCT 44.7 43.6 42.9  PLT 211 234 224   BMET  Recent Labs Lab 03/02/12 1445 03/02/12 1813 03/03/12 0520  NA 134*  --  138  K 4.8  --  3.5  CL 100  --  103  CO2 23  --  27  BUN 18  --  13  CREATININE 0.79 0.81 0.73  CALCIUM 9.2  --  8.9  PROT 7.2  --   --   BILITOT 0.2*  --   --   ALKPHOS 82  --   --   ALT 21  --   --   AST 21  --   --   GLUCOSE 289*  --  190*   Results for orders placed during the hospital encounter of 03/02/12 (from the past 72 hour(s))  TSH     Status: None   Collection Time    03/03/12  9:58 AM      Result Value Range   TSH 1.454  0.350 - 4.500 uIU/mL  MAGNESIUM     Status: None   Collection Time    03/03/12  4:29 PM      Result Value Range   Magnesium 1.9  1.5 - 2.5 mg/dL  TROPONIN I     Status: None   Collection Time    03/03/12 11:55 PM      Result Value Range   Troponin I <0.30  <0.30 ng/mL       Patient condition at time of discharge/disposition: stable  Disposition-home   Follow up issues: 1. DM II, will need titration up on medications  2. Switched from Persantine to Plavix for secondary stroke prevention, pt was not on therapeutic dosing. 3. Subdural Hygroma - F/U with Dr. Yetta Barre, Neurosurgery, in 1-2 months.  4. Home with Home Health/PT.  May need SNF placement if not working  well  Discharge follow up:  Follow-up Information   Schedule an appointment as soon as possible for a visit to follow up. (to be seen within 7 days of discharge. )    Contact information:   Primary Care Provider  Discharge Orders   Future Orders Complete By Expires     (HEART FAILURE PATIENTS) Call MD:  Anytime you have any of the following symptoms: 1) 3 pound weight gain in 24 hours or 5 pounds in 1 week 2) shortness of breath, with or without a dry hacking cough 3) swelling in the hands, feet or stomach 4) if you have to sleep on extra pillows at night in order to breathe.  As directed     Call MD for:  difficulty breathing, headache or visual disturbances  As directed     Call MD for:  extreme fatigue  As directed     Call MD for:  persistant dizziness or light-headedness  As directed     Diet - low sodium heart healthy  As directed     Increase activity slowly  As directed         Discharge Instructions: Please refer to Patient Instructions section of EMR for full details.  Patient was counseled important signs and symptoms that should prompt return to medical care, changes in medications, dietary instructions, activity restrictions, and follow up appointments.  Significant instructions noted below:    Discharge Medications   Medication List    STOP taking these medications       dipyridamole 50 MG tablet  Commonly known as:  PERSANTINE      TAKE these medications       amLODipine 10 MG tablet  Commonly known as:  NORVASC  Take 10 mg by mouth daily.     clopidogrel 75 MG tablet  Commonly known as:  PLAVIX  Take 1 tablet (75 mg total) by mouth daily with breakfast.     glyBURIDE 2.5 MG tablet  Commonly known as:  DIABETA  Take 3.75 mg by mouth daily with breakfast.     lisinopril 10 MG tablet  Commonly known as:  PRINIVIL  Take 1 tablet (10 mg total) by mouth daily.     metFORMIN 500 MG tablet  Commonly known as:  GLUCOPHAGE  Take 250 mg by mouth daily.      metoprolol tartrate 25 MG tablet  Commonly known as:  LOPRESSOR  Take 1 tablet (25 mg total) by mouth 2 (two) times daily.     multivitamin with minerals Tabs  Take 1 tablet by mouth daily.     omeprazole 20 MG capsule  Commonly known as:  PRILOSEC  Take 20 mg by mouth daily.            Gildardo Cranker, DO of [facility] 03/05/2012 9:46 PM

## 2012-03-06 NOTE — Discharge Summary (Signed)
Family Medicine Teaching Service  Discharge Note : Attending Jeff Chandel Zaun MD Pager [phone] Inpatient Team Pager:  [phone]  I have reviewed this patient and the patient's chart and have discussed discharge planning with the resident at the time of discharge. I agree with the discharge plan as above.    

## 2012-03-28 ENCOUNTER — Other Ambulatory Visit (HOSPITAL_COMMUNITY): Payer: Self-pay | Admitting: Family Medicine

## 2012-04-28 ENCOUNTER — Other Ambulatory Visit (HOSPITAL_COMMUNITY): Payer: Self-pay | Admitting: Family Medicine

## 2014-08-29 ENCOUNTER — Emergency Department: Payer: Medicare Other

## 2014-08-29 ENCOUNTER — Encounter: Payer: Self-pay | Admitting: Emergency Medicine

## 2014-08-29 ENCOUNTER — Observation Stay
Admission: EM | Admit: 2014-08-29 | Discharge: 2014-08-30 | Disposition: A | Discharge status: Home / Self Care | Payer: Medicare Other | Attending: Internal Medicine | Admitting: Internal Medicine

## 2014-08-29 DIAGNOSIS — Z8249 Family history of ischemic heart disease and other diseases of the circulatory system: Secondary | ICD-10-CM | POA: Diagnosis not present

## 2014-08-29 DIAGNOSIS — K219 Gastro-esophageal reflux disease without esophagitis: Secondary | ICD-10-CM | POA: Diagnosis not present

## 2014-08-29 DIAGNOSIS — L97509 Non-pressure chronic ulcer of other part of unspecified foot with unspecified severity: Secondary | ICD-10-CM

## 2014-08-29 DIAGNOSIS — E11628 Type 2 diabetes mellitus with other skin complications: Secondary | ICD-10-CM | POA: Diagnosis present

## 2014-08-29 DIAGNOSIS — Z7902 Long term (current) use of antithrombotics/antiplatelets: Secondary | ICD-10-CM | POA: Insufficient documentation

## 2014-08-29 DIAGNOSIS — IMO0002 Reserved for concepts with insufficient information to code with codable children: Secondary | ICD-10-CM | POA: Diagnosis present

## 2014-08-29 DIAGNOSIS — E11621 Type 2 diabetes mellitus with foot ulcer: Principal | ICD-10-CM | POA: Diagnosis present

## 2014-08-29 DIAGNOSIS — L97511 Non-pressure chronic ulcer of other part of right foot limited to breakdown of skin: Secondary | ICD-10-CM | POA: Diagnosis not present

## 2014-08-29 DIAGNOSIS — E1165 Type 2 diabetes mellitus with hyperglycemia: Secondary | ICD-10-CM | POA: Diagnosis not present

## 2014-08-29 DIAGNOSIS — Z9049 Acquired absence of other specified parts of digestive tract: Secondary | ICD-10-CM | POA: Insufficient documentation

## 2014-08-29 DIAGNOSIS — E1122 Type 2 diabetes mellitus with diabetic chronic kidney disease: Secondary | ICD-10-CM | POA: Diagnosis not present

## 2014-08-29 DIAGNOSIS — I10 Essential (primary) hypertension: Secondary | ICD-10-CM | POA: Diagnosis present

## 2014-08-29 DIAGNOSIS — Z8673 Personal history of transient ischemic attack (TIA), and cerebral infarction without residual deficits: Secondary | ICD-10-CM

## 2014-08-29 DIAGNOSIS — E872 Acidosis: Secondary | ICD-10-CM | POA: Diagnosis not present

## 2014-08-29 DIAGNOSIS — L03039 Cellulitis of unspecified toe: Secondary | ICD-10-CM | POA: Diagnosis not present

## 2014-08-29 DIAGNOSIS — E1140 Type 2 diabetes mellitus with diabetic neuropathy, unspecified: Secondary | ICD-10-CM | POA: Diagnosis not present

## 2014-08-29 DIAGNOSIS — N183 Chronic kidney disease, stage 3 unspecified: Secondary | ICD-10-CM | POA: Diagnosis present

## 2014-08-29 DIAGNOSIS — M8580 Other specified disorders of bone density and structure, unspecified site: Secondary | ICD-10-CM | POA: Insufficient documentation

## 2014-08-29 DIAGNOSIS — R05 Cough: Secondary | ICD-10-CM | POA: Diagnosis not present

## 2014-08-29 DIAGNOSIS — L600 Ingrowing nail: Secondary | ICD-10-CM | POA: Insufficient documentation

## 2014-08-29 DIAGNOSIS — I129 Hypertensive chronic kidney disease with stage 1 through stage 4 chronic kidney disease, or unspecified chronic kidney disease: Secondary | ICD-10-CM | POA: Insufficient documentation

## 2014-08-29 DIAGNOSIS — Z79899 Other long term (current) drug therapy: Secondary | ICD-10-CM | POA: Diagnosis not present

## 2014-08-29 DIAGNOSIS — E08621 Diabetes mellitus due to underlying condition with foot ulcer: Secondary | ICD-10-CM

## 2014-08-29 DIAGNOSIS — Z833 Family history of diabetes mellitus: Secondary | ICD-10-CM | POA: Insufficient documentation

## 2014-08-29 DIAGNOSIS — L97519 Non-pressure chronic ulcer of other part of right foot with unspecified severity: Secondary | ICD-10-CM

## 2014-08-29 DIAGNOSIS — B351 Tinea unguium: Secondary | ICD-10-CM | POA: Diagnosis not present

## 2014-08-29 DIAGNOSIS — R531 Weakness: Secondary | ICD-10-CM | POA: Insufficient documentation

## 2014-08-29 HISTORY — DX: Chronic kidney disease, stage 3 unspecified: N18.30

## 2014-08-29 HISTORY — DX: Chronic kidney disease, stage 3 (moderate): N18.3

## 2014-08-29 HISTORY — DX: Gastro-esophageal reflux disease without esophagitis: K21.9

## 2014-08-29 HISTORY — DX: Cerebral infarction, unspecified: I63.9

## 2014-08-29 LAB — BASIC METABOLIC PANEL
Anion gap: 6 (ref 5–15)
BUN: 23 mg/dL — AB (ref 6–20)
CO2: 28 mmol/L (ref 22–32)
CREATININE: 1.21 mg/dL (ref 0.61–1.24)
Calcium: 9 mg/dL (ref 8.9–10.3)
Chloride: 104 mmol/L (ref 101–111)
GFR, EST NON AFRICAN AMERICAN: 52 mL/min — AB (ref >60)
Glucose, Bld: 170 mg/dL — ABNORMAL HIGH (ref 65–99)
Potassium: 4.2 mmol/L (ref 3.5–5.1)
SODIUM: 138 mmol/L (ref 135–145)

## 2014-08-29 LAB — CBC WITH DIFFERENTIAL/PLATELET
BASOS ABS: 0.1 10*3/uL (ref 0–0.1)
BASOS PCT: 1 %
EOS ABS: 0.3 10*3/uL (ref 0–0.7)
EOS PCT: 4 %
HCT: 44.1 % (ref 40.0–52.0)
Hemoglobin: 14.3 g/dL (ref 13.0–18.0)
Lymphocytes Relative: 23 %
Lymphs Abs: 2.2 10*3/uL (ref 1.0–3.6)
MCH: 28 pg (ref 26.0–34.0)
MCHC: 32.5 g/dL (ref 32.0–36.0)
MCV: 86.2 fL (ref 80.0–100.0)
Monocytes Absolute: 1 10*3/uL (ref 0.2–1.0)
Monocytes Relative: 11 %
Neutro Abs: 6.1 10*3/uL (ref 1.4–6.5)
Neutrophils Relative %: 61 %
PLATELETS: 231 10*3/uL (ref 150–440)
RBC: 5.12 MIL/uL (ref 4.40–5.90)
RDW: 13.2 % (ref 11.5–14.5)
WBC: 9.7 10*3/uL (ref 3.8–10.6)

## 2014-08-29 LAB — GLUCOSE, CAPILLARY: GLUCOSE-CAPILLARY: 110 mg/dL — AB (ref 65–99)

## 2014-08-29 MED ORDER — ONDANSETRON HCL 4 MG PO TABS: 4.0000 mg | ORAL_TABLET | Freq: Four times a day (QID) | ORAL | Status: DC | PRN

## 2014-08-29 MED ORDER — INSULIN ASPART 100 UNIT/ML ~~LOC~~ SOLN: 0.0000 [IU] | Freq: Every day | SUBCUTANEOUS | Status: DC

## 2014-08-29 MED ORDER — IPRATROPIUM-ALBUTEROL 0.5-2.5 (3) MG/3ML IN SOLN: 3.0000 mL | RESPIRATORY_TRACT | Status: DC | PRN

## 2014-08-29 MED ORDER — CLOPIDOGREL BISULFATE 75 MG PO TABS: 75.0000 mg | ORAL_TABLET | Freq: Every day | ORAL | Status: DC

## 2014-08-29 MED ORDER — METOPROLOL TARTRATE 25 MG PO TABS
25.0000 mg | ORAL_TABLET | Freq: Two times a day (BID) | ORAL | Status: DC
  Administered 2014-08-30: 25 mg via ORAL
  Filled 2014-08-29: qty 1

## 2014-08-29 MED ORDER — METFORMIN HCL 500 MG PO TABS: 250.0000 mg | ORAL_TABLET | Freq: Every day | ORAL | Status: DC

## 2014-08-29 MED ORDER — AMLODIPINE BESYLATE 10 MG PO TABS: 10.0000 mg | ORAL_TABLET | Freq: Every day | ORAL | Status: DC

## 2014-08-29 MED ORDER — INSULIN ASPART 100 UNIT/ML ~~LOC~~ SOLN: 0.0000 [IU] | Freq: Three times a day (TID) | SUBCUTANEOUS | Status: DC

## 2014-08-29 MED ORDER — SODIUM CHLORIDE 0.9 % IV SOLN
INTRAVENOUS | Status: AC
  Administered 2014-08-30: via INTRAVENOUS

## 2014-08-29 MED ORDER — PANTOPRAZOLE SODIUM 40 MG PO TBEC: 40.0000 mg | DELAYED_RELEASE_TABLET | Freq: Every day | ORAL | Status: DC

## 2014-08-29 MED ORDER — SODIUM CHLORIDE 0.9 % IV SOLN
3.0000 g | Freq: Once | INTRAVENOUS | Status: AC
  Administered 2014-08-29: 3 g via INTRAVENOUS
  Filled 2014-08-29: qty 3

## 2014-08-29 MED ORDER — ONDANSETRON HCL 4 MG/2ML IJ SOLN: 4.0000 mg | Freq: Four times a day (QID) | INTRAMUSCULAR | Status: DC | PRN

## 2014-08-29 MED ORDER — SODIUM CHLORIDE 0.9 % IV SOLN
3.0000 g | Freq: Four times a day (QID) | INTRAVENOUS | Status: DC
  Administered 2014-08-30 (×2): 3 g via INTRAVENOUS
  Filled 2014-08-29 (×6): qty 3

## 2014-08-29 MED ORDER — ACETAMINOPHEN 650 MG RE SUPP: 650.0000 mg | Freq: Four times a day (QID) | RECTAL | Status: DC | PRN

## 2014-08-29 MED ORDER — ENOXAPARIN SODIUM 40 MG/0.4ML ~~LOC~~ SOLN
40.0000 mg | Freq: Every day | SUBCUTANEOUS | Status: DC
  Administered 2014-08-30: 01:00:00 40 mg via SUBCUTANEOUS
  Filled 2014-08-29: qty 0.4

## 2014-08-29 MED ORDER — IPRATROPIUM-ALBUTEROL 0.5-2.5 (3) MG/3ML IN SOLN
3.0000 mL | Freq: Once | RESPIRATORY_TRACT | Status: AC
  Administered 2014-08-29: 3 mL via RESPIRATORY_TRACT
  Filled 2014-08-29: qty 3

## 2014-08-29 MED ORDER — LISINOPRIL 10 MG PO TABS: 10.0000 mg | ORAL_TABLET | Freq: Every day | ORAL | Status: DC

## 2014-08-29 MED ORDER — OXYCODONE HCL 5 MG PO TABS: 5.0000 mg | ORAL_TABLET | ORAL | Status: DC | PRN

## 2014-08-29 MED ORDER — ACETAMINOPHEN 325 MG PO TABS: 650.0000 mg | ORAL_TABLET | Freq: Four times a day (QID) | ORAL | Status: DC | PRN

## 2014-08-29 NOTE — ED Notes (Signed)
Patient transported to X-ray 

## 2014-08-29 NOTE — ED Notes (Signed)
Pt arrived to the ED accompanied by family for complaints of big toe pain on the right foot. Pt's family got concern when they noticed that there were maggots faklling off of the injured foot.  Pt is AOx4 in no apparent distress.

## 2014-08-29 NOTE — Progress Notes (Signed)
ANTIBIOTIC CONSULT NOTE - INITIAL  Pharmacy Consult for Unasyn Indication: wound infection  No Known Allergies  Patient Measurements: Height: 5\' 10"  (177.8 cm) Weight: 210 lb (95.255 kg) IBW/kg (Calculated) : 73 Adjusted Body Weight:   Vital Signs: Temp: 97.5 F (36.4 C) (08/18 2040) Temp Source: Oral (08/18 2040) BP: 172/80 mmHg (08/18 2318) Pulse Rate: 67 (08/18 2318) Intake/Output from previous day:   Intake/Output from this shift:    Labs:  Recent Labs  08/29/14 2108  WBC 9.7  HGB 14.3  PLT 231  CREATININE 1.21   Estimated Creatinine Clearance: 49.8 mL/min (by C-G formula based on Cr of 1.21). No results for input(s): VANCOTROUGH, VANCOPEAK, VANCORANDOM, GENTTROUGH, GENTPEAK, GENTRANDOM, TOBRATROUGH, TOBRAPEAK, TOBRARND, AMIKACINPEAK, AMIKACINTROU, AMIKACIN in the last 72 hours.   Microbiology: No results found for this or any previous visit (from the past 720 hour(s)).  Medical History: Past Medical History  Diagnosis Date  . Diabetes mellitus without complication   . Hypertension   . CVA (cerebral infarction)   . GERD (gastroesophageal reflux disease)   . CKD (chronic kidney disease), stage III     Medications:  Infusions:  . sodium chloride     Assessment: 89 yom cc nail problem right great toe diabetic foot ulcer x several days. Erythema around toenail, seems to track under nail. Pt found maggots in wound, ED confirmed. No drainage noted.   Goal of Therapy:  Maintain temperature/labs/vitals, resolve infection  Plan:  Expected duration 10 days with resolution of temperature and/or normalization of WBC. Ordered Unasyn 3 gm IV Q6H. Will continue to follow.   Laural Benes, Pharm.D. Clinical Pharmacist 08/29/2014,11:52 PM

## 2014-08-29 NOTE — ED Provider Notes (Signed)
[facility] Emergency Department Provider Note ____________________________________________  Time seen: Approximately 9:56 PM  I have reviewed the triage vital signs and the nursing notes.   HISTORY  Chief Complaint Nail Problem   HPI Roberto Griffin is a 81 y.o. male is a history of non-insulin-dependent diabetes who is brought in by his family after noticing erythema of his foot yesterday and maggots under the nail of the great toe & in between his toes today.He has not had any complaints and has otherwise been well with no fevers, vomiting, cough, or for any other concerns.  He lives alone but his children live right next to him and check on him daily; his daughter makes him breakfast daily and had been taking care of his feet.  Past Medical History  Diagnosis Date  . Diabetes mellitus without complication   . Hypertension   . CVA (cerebral infarction)   . GERD (gastroesophageal reflux disease)   . CKD (chronic kidney disease), stage III     Patient Active Problem List   Diagnosis Date Noted  . Diabetic foot ulcer 08/29/2014  . Type 2 diabetes, uncontrolled, with cellulitis of toe 08/29/2014  . GERD (gastroesophageal reflux disease) 08/29/2014  . H/O: CVA (cerebrovascular accident) 08/29/2014  . CKD (chronic kidney disease), stage III 08/29/2014  . Generalized weakness 03/03/2012  . DM (diabetes mellitus) type 2, uncontrolled, with ketoacidosis 03/03/2012  . HTN (hypertension) 03/03/2012    Past Surgical History  Procedure Laterality Date  . Cholecystectomy    . Appendectomy      No current outpatient prescriptions on file.  Allergies Review of patient's allergies indicates no known allergies.  Family History  Problem Relation Age of Onset  . Diabetes Mother   . Cerebral aneurysm Father     Social History Social History  Substance Use Topics  . Smoking status: Never Smoker   . Smokeless tobacco: None  . Alcohol Use: No     Review of Systems Constitutional: No fever/ Cardiovascular: Denies chest pain. Respiratory:shortness of breath "for years" but they have noticed occasional wheezing recently Gastrointestinal: No abdominal pain.  No nausea, no vomiting Endocrine:No recent weight change 10-point ROS otherwise negative.  ____________________________________________   PHYSICAL EXAM:  VITAL SIGNS: ED Triage Vitals  Enc Vitals Group     BP 08/29/14 2040 170/69 mmHg     Pulse Rate 08/29/14 2040 78     Resp 08/29/14 2040 18     Temp 08/29/14 2040 97.5 F (36.4 C)     Temp Source 08/29/14 2040 Oral     SpO2 08/29/14 2040 96 %     Weight 08/29/14 2040 210 lb (95.255 kg)     Height 08/29/14 2040 5\' 10"  (1.778 m)     Head Cir --      Peak Flow --      Pain Score 08/29/14 2043 0     Pain Loc --      Pain Edu? --      Excl. in [place]? --    Constitutional: Alert and oriented. Well appearing and in no acute distress. Eyes: Conjunctivae are normal. PERRL. EOMI. Head: Atraumatic. Nose: No congestion/rhinnorhea. Mouth/Throat: Mucous membranes are slightly dry.  Oropharynx non-erythematous. Neck: No stridor.   Lymphatic: No cervical lymphadenopathy. Cardiovascular: Normal rate, regular rhythm. Grossly normal heart sounds.  Peripheral pulses 2+ B Respiratory: Normal respiratory effort.  No retractions. Lungs CTAB. Gastrointestinal: Soft and nontender. No distention. Normal bowel sounds.  Musculoskeletal: B foot edema; no calf TTP  Neurologic:  Normal speech and language. No gross focal neurologic deficits are appreciated. Speech is normal.  Skin:  Skin is warm, dry and intact except R great toe & foot; R great toe erythematous; worse on lateral aspect; nail hypertrophied with maggots visible under lateral aspect Psychiatric: Mood and affect are normal. Speech and behavior are normal.  ____________________________________________   LABS (all labs ordered are listed, but only abnormal results are  displayed)  Labs Reviewed  BASIC METABOLIC PANEL - Abnormal; Notable for the following:    Glucose, Bld 170 (*)    BUN 23 (*)    GFR calc non Af Amer 52 (*)    All other components within normal limits  CULTURE, BLOOD (ROUTINE X 2)  CULTURE, BLOOD (ROUTINE X 2)  CBC WITH DIFFERENTIAL/PLATELET  CBC  CREATININE, SERUM  BASIC METABOLIC PANEL  CBC  PROTIME-INR  APTT  HEMOGLOBIN A1C   ____________________________________________  RADIOLOGY  R foot xr-IMPRESSION: Marked soft tissue swelling over the dorsum of the mid and forefoot. No air or calcifications seen in the swelling. Based on the history, suspect inflammatory etiology, although a mass of neoplastic etiology is a differential consideration.  Bones are diffusely osteoporotic. No fracture or dislocation. No erosive change or bony destruction. No appreciable joint space narrowing.  ____________________________________________ ____________________________________________   INITIAL IMPRESSION / ASSESSMENT AND PLAN / ED COURSE  Pertinent labs & imaging results that were available during my care of the patient were reviewed by me and considered in my medical decision making (see chart for details).  Will admit to [facility], Dr. Jannifer Franklin, for diabetic foot. I discussed briefly with podiatry, Dr. Cleda Mccreedy, who will be glad to see patient in consultation in the morning if ordered by [facility]; suspects wound will be treated with removal of nail.  ____________________________________________   FINAL CLINICAL IMPRESSION(S) / ED DIAGNOSES Diabetic foot infection, R, with maggot infestation of R great toe    Roberto Ort, MD 08/29/14 2348

## 2014-08-29 NOTE — H&P (Signed)
[facility] [place] NAME: Roberto Griffin    MR#:  [id number]  DATE OF BIRTH:  1924-09-08  DATE OF ADMISSION:  08/29/2014  PRIMARY CARE PHYSICIAN: No primary care provider on file.   REQUESTING/REFERRING PHYSICIAN: Robet Leu, MD  CHIEF COMPLAINT:   Chief Complaint  Patient presents with  . Nail Problem    HISTORY OF PRESENT ILLNESS:  Roberto Griffin  is a 81 y.o. male who presents with right great toe diabetic foot ulcer. Patient states that he began having symptoms on his right great toe several days ago, but per his family who is present with him in the ED he states that he did not complain about it until about 2 days ago. Patient has erythema around that toenail, and a wound that seems to track under the toenail. Family member presently states that she began cleaning this wound after he complained about it, and that while cleaning it she noticed maggots present in the wound. The same was noted in the ED on evaluation. IV antibiotics were given, and hospitalists were called for admission for diabetic foot ulcer treatment. She denies any history of fever or chills or other systemic signs of infection at home. However, he does complain of some long-standing chronic coughing and some more acute wheezing.  PAST MEDICAL HISTORY:   Past Medical History  Diagnosis Date  . Diabetes mellitus without complication   . Hypertension   . CVA (cerebral infarction)   . GERD (gastroesophageal reflux disease)   . CKD (chronic kidney disease), stage III     PAST SURGICAL HISTORY:   Past Surgical History  Procedure Laterality Date  . Cholecystectomy    . Appendectomy      SOCIAL HISTORY:   Social History  Substance Use Topics  . Smoking status: Never Smoker   . Smokeless tobacco: Not on file  . Alcohol Use: No    FAMILY HISTORY:   Family History  Problem Relation Age of Onset  . Diabetes Mother   . Cerebral aneurysm Father      DRUG ALLERGIES:  No Known Allergies  MEDICATIONS AT HOME:   Prior to Admission medications   Medication Sig Start Date End Date Taking? Authorizing Provider  amLODipine (NORVASC) 10 MG tablet Take 10 mg by mouth daily.    Historical Provider, MD  clopidogrel (PLAVIX) 75 MG tablet Take 1 tablet (75 mg total) by mouth daily with breakfast. 03/04/12   Cletus Gash, MD  glyBURIDE (DIABETA) 2.5 MG tablet Take 3.75 mg by mouth daily with breakfast.    Historical Provider, MD  lisinopril (PRINIVIL) 10 MG tablet Take 1 tablet (10 mg total) by mouth daily. 03/04/12   Cletus Gash, MD  metFORMIN (GLUCOPHAGE) 500 MG tablet Take 250 mg by mouth daily.    Historical Provider, MD  metoprolol tartrate (LOPRESSOR) 25 MG tablet Take 1 tablet (25 mg total) by mouth 2 (two) times daily. 03/04/12   Cletus Gash, MD  Multiple Vitamin (MULTIVITAMIN WITH MINERALS) TABS Take 1 tablet by mouth daily.    Historical Provider, MD  omeprazole (PRILOSEC) 20 MG capsule Take 20 mg by mouth daily.    Historical Provider, MD    REVIEW OF SYSTEMS:  Review of Systems  Constitutional: Negative for fever, chills, weight loss and malaise/fatigue.  HENT: Negative for ear pain, hearing loss and tinnitus.   Eyes: Negative for blurred vision, double vision, pain and redness.  Respiratory: Positive for cough and wheezing. Negative for hemoptysis  and shortness of breath.   Cardiovascular: Negative for chest pain, palpitations, orthopnea and leg swelling.  Gastrointestinal: Negative for nausea, vomiting, abdominal pain, diarrhea and constipation.  Genitourinary: Negative for dysuria, frequency and hematuria.  Musculoskeletal: Negative for back pain, joint pain and neck pain.  Skin:       Cellulitis of the right great toe with a diabetic foot ulcer tracking of the toenail. No acne, rash.  Neurological: Negative for dizziness, tremors, focal weakness and weakness.  Endo/Heme/Allergies: Negative for polydipsia.  Does not bruise/bleed easily.  Psychiatric/Behavioral: Negative for depression. The patient is not nervous/anxious and does not have insomnia.      VITAL SIGNS:   Filed Vitals:   08/29/14 2040 08/29/14 2139  BP: 170/69 156/83  Pulse: 78 70  Temp: 97.5 F (36.4 C)   TempSrc: Oral   Resp: 18 22  Height: 5\' 10"  (1.778 m)   Weight: 95.255 kg (210 lb)   SpO2: 96% 97%   Wt Readings from Last 3 Encounters:  08/29/14 95.255 kg (210 lb)  03/03/12 96.8 kg (213 lb 6.5 oz)    PHYSICAL EXAMINATION:  Physical Exam  Vitals reviewed. Constitutional: He is oriented to person, place, and time. He appears well-developed and well-nourished. No distress.  HENT:  Head: Normocephalic and atraumatic.  Mouth/Throat: Oropharynx is clear and moist.  Eyes: Conjunctivae and EOM are normal. Pupils are equal, round, and reactive to light. No scleral icterus.  Neck: Normal range of motion. Neck supple. No JVD present. No thyromegaly present.  Cardiovascular: Normal rate, regular rhythm and intact distal pulses.  Exam reveals no gallop and no friction rub.   No murmur heard. Respiratory: Effort normal and breath sounds normal. No respiratory distress. He has no wheezes. He has no rales.  GI: Soft. Bowel sounds are normal. He exhibits no distension. There is no tenderness.  Musculoskeletal: Normal range of motion. He exhibits no edema.  No arthritis, no gout  Lymphadenopathy:    He has no cervical adenopathy.  Neurological: He is alert and oriented to person, place, and time. No cranial nerve deficit.  No dysarthria, no aphasia  Skin: Skin is warm and dry. No rash noted. There is erythema (right great toe).  Diabetic foot ulcer right great toe tracking under the toenail, no drainage present on this writer's examination, significantly tender to palpation. Mag is reported present in the wound.  Psychiatric: He has a normal mood and affect. His behavior is normal. Judgment and thought content normal.     LABORATORY PANEL:   CBC  Recent Labs Lab 08/29/14 2108  WBC 9.7  HGB 14.3  HCT 44.1  PLT 231   ------------------------------------------------------------------------------------------------------------------  Chemistries   Recent Labs Lab 08/29/14 2108  NA 138  K 4.2  CL 104  CO2 28  GLUCOSE 170*  BUN 23*  CREATININE 1.21  CALCIUM 9.0   ------------------------------------------------------------------------------------------------------------------  Cardiac Enzymes No results for input(s): TROPONINI in the last 168 hours. ------------------------------------------------------------------------------------------------------------------  RADIOLOGY:  Dg Chest 2 View  08/29/2014   CLINICAL DATA:  Wheezing.  EXAM: CHEST  2 VIEW  COMPARISON:  03/03/2012.  FINDINGS: Borderline enlarged cardiac silhouette. Clear lungs with normal vascularity. Diffuse osteopenia.  IMPRESSION: No acute abnormality.   Electronically Signed   By: Claudie Revering M.D.   On: 08/29/2014 22:44   Dg Foot Complete Right  08/29/2014   CLINICAL DATA:  Cellulitis with right first toe wound  EXAM: RIGHT FOOT COMPLETE - 3+ VIEW  COMPARISON:  None.  FINDINGS: Frontal, oblique,  and lateral views obtained. There is marked soft tissue swelling over the dorsum of the foot. There is no demonstrable air or calcification within this soft tissue fullness. There is no demonstrable fracture or dislocation. Bones are diffusely osteoporotic. Joint spaces appear intact. There is no erosive change or bony destruction.  IMPRESSION: Marked soft tissue swelling over the dorsum of the mid and forefoot. No air or calcifications seen in the swelling. Based on the history, suspect inflammatory etiology, although a mass of neoplastic etiology is a differential consideration.  Bones are diffusely osteoporotic. No fracture or dislocation. No erosive change or bony destruction. No appreciable joint space narrowing.   Electronically  Signed   By: Lowella Grip III M.D.   On: 08/29/2014 21:32    EKG:   Orders placed or performed during the hospital encounter of 03/02/12  . EKG 12-Lead  . EKG 12-Lead  . EKG 12-Lead  . EKG 12-Lead  . EKG 12-Lead  . EKG 12-Lead  . CARDIAC MONITORING STRIP  . CARDIAC MONITORING STRIP  . CARDIAC MONITORING STRIP  . CARDIAC MONITORING STRIP  . CARDIAC MONITORING STRIP  . CARDIAC MONITORING STRIP  . CARDIAC MONITORING STRIP  . EKG 12-Lead  . EKG 12-Lead  . EKG 12-Lead  . EKG 12-Lead  . CARDIAC MONITORING STRIP  . CARDIAC MONITORING STRIP  . EKG    IMPRESSION AND PLAN:  Principal Problem:   Diabetic foot ulcer - right great toe, with surrounding erythema, no drainage noted but maggots reported present in the wound. IV Unasyn given the ED, we'll continue this on admission, and get a podiatry consult for possible debridement of the wound. X-ray did not show bony involvement. Active Problems:   Type 2 diabetes, uncontrolled, with cellulitis of toe - we'll check a hemoglobin A1c, and maintain the patient on sliding scale insulin with appropriate fingerstick glucose checks, and a diabetic diet.   HTN (hypertension) - continue home medications for this   GERD (gastroesophageal reflux disease) - equal on home dose PPI   H/O: CVA (cerebrovascular accident) - continue the patient's home antiplatelet medications aspirin and Plavix   CKD (chronic kidney disease), stage III - avoid nephrotoxins, monitor serum creatinine  All the records are reviewed and case discussed with ED provider. Management plans discussed with the patient and/or family.  DVT PROPHYLAXIS: SubQ lovenox  ADMISSION STATUS: Observation  CODE STATUS: Full Advance Directive Documentation        Most Recent Value   Type of Advance Directive  Living will   Pre-existing out of facility DNR order (yellow form or pink MOST form)     "MOST" Form in Place?        TOTAL TIME TAKING CARE OF THIS PATIENT: 40 minutes.     Natacha Jepsen [place] 08/29/2014, 10:54 PM  [facility]  Office  [phone]  CC: Primary care physician; No primary care provider on file.

## 2014-08-30 LAB — BASIC METABOLIC PANEL
Anion gap: 5 (ref 5–15)
BUN: 21 mg/dL — AB (ref 6–20)
CHLORIDE: 106 mmol/L (ref 101–111)
CO2: 28 mmol/L (ref 22–32)
CREATININE: 0.9 mg/dL (ref 0.61–1.24)
Calcium: 8.5 mg/dL — ABNORMAL LOW (ref 8.9–10.3)
GFR calc Af Amer: >60 mL/min (ref >60)
GFR calc non Af Amer: >60 mL/min (ref >60)
Glucose, Bld: 84 mg/dL (ref 65–99)
Potassium: 3.6 mmol/L (ref 3.5–5.1)
SODIUM: 139 mmol/L (ref 135–145)

## 2014-08-30 LAB — CBC
HEMATOCRIT: 41.1 % (ref 40.0–52.0)
Hemoglobin: 13.4 g/dL (ref 13.0–18.0)
MCH: 28.3 pg (ref 26.0–34.0)
MCHC: 32.6 g/dL (ref 32.0–36.0)
MCV: 86.6 fL (ref 80.0–100.0)
Platelets: 201 10*3/uL (ref 150–440)
RBC: 4.75 MIL/uL (ref 4.40–5.90)
RDW: 13.2 % (ref 11.5–14.5)
WBC: 8.7 10*3/uL (ref 3.8–10.6)

## 2014-08-30 LAB — PROTIME-INR
INR: 1.12
Prothrombin Time: 14.6 seconds (ref 11.4–15.0)

## 2014-08-30 LAB — HEMOGLOBIN A1C: HEMOGLOBIN A1C: 6.2 % — AB (ref 4.0–6.0)

## 2014-08-30 LAB — APTT: APTT: 42 s — AB (ref 24–36)

## 2014-08-30 LAB — GLUCOSE, CAPILLARY: GLUCOSE-CAPILLARY: 79 mg/dL (ref 65–99)

## 2014-08-30 MED ORDER — HYDRALAZINE HCL 20 MG/ML IJ SOLN: 10.0000 mg | INTRAMUSCULAR | Status: DC | PRN

## 2014-08-30 MED ORDER — CEPHALEXIN 500 MG PO CAPS: 500.0000 mg | ORAL_CAPSULE | Freq: Two times a day (BID) | ORAL | Status: DC

## 2014-08-30 NOTE — Plan of Care (Addendum)
Problem: Discharge Progression Outcomes Goal: Discharge plan in place and appropriate Outcome: Progressing Pt is from home alone, daughter lives next door Hx of DM, HTN, CVA, continue with home medications. Need family present to complete Profile. Goal: Other Discharge Outcomes/Goals Outcome: Progressing Patient is disoriented to time, poor historian. No c/o pain, incontinent of urine at times. NS at 50 ml/hr. Pt has maggots in between his right toes, two visibly seen at this time. Bilateral lower extremities red with edema. No family present at the bedside.

## 2014-08-30 NOTE — Progress Notes (Signed)
[facility] at [place] NAME: Roberto Griffin    MR#:  [id number]  DATE OF BIRTH:  10/19/1924  SUBJECTIVE:81 yr old male pt  Admitted with right great toe diabetic foot ulcer.on IV Unasyn.waiting for podiatry placement,  CHIEF COMPLAINT:   Chief Complaint  Patient presents with  . Nail Problem    REVIEW OF SYSTEMS:   ROS CONSTITUTIONAL: No fever, fatigue or weakness.  EYES: No blurred or double vision.  EARS, NOSE, AND THROAT: No tinnitus or ear pain.  RESPIRATORY: No cough, shortness of breath, wheezing or hemoptysis.  CARDIOVASCULAR: No chest pain, orthopnea, edema.  GASTROINTESTINAL: No nausea, vomiting, diarrhea or abdominal pain.  GENITOURINARY: No dysuria, hematuria.  ENDOCRINE: No polyuria, nocturia,  HEMATOLOGY: No anemia, easy bruising or bleeding SKIN: No rash or lesion. MUSCULOSKELETAL: No joint pain or arthritis.   NEUROLOGIC: No tingling, numbness, weakness.  PSYCHIATRY: No anxiety or depression.   DRUG ALLERGIES:  No Known Allergies  VITALS:  Blood pressure 155/67, pulse 62, temperature 97.6 F (36.4 C), temperature source Oral, resp. rate 18, height 5\' 10"  (1.778 m), weight 94.847 kg (209 lb 1.6 oz), SpO2 95 %.  PHYSICAL EXAMINATION:  GENERAL:  81 y.o.-year-old patient lying in the bed with no acute distress.  EYES: Pupils equal, round, reactive to light and accommodation. No scleral icterus. Extraocular muscles intact.  HEENT: Head atraumatic, normocephalic. Oropharynx and nasopharynx clear.  NECK:  Supple, no jugular venous distention. No thyroid enlargement, no tenderness.  LUNGS: Normal breath sounds bilaterally, no wheezing, rales,rhonchi or crepitation. No use of accessory muscles of respiration.  CARDIOVASCULAR: S1, S2 normal. No murmurs, rubs, or gallops.  ABDOMEN: Soft, nontender, nondistended. Bowel sounds present. No organomegaly or mass.  EXTREMITIES: No pedal edema, cyanosis, or clubbing.   NEUROLOGIC: Cranial nerves II through XII are intact. Muscle strength 5/5 in all extremities. Sensation intact. Gait not checked.  PSYCHIATRIC: The patient is alert and oriented x 3.  SKIN: No obvious rash, lesion, or ulcer.    LABORATORY PANEL:   CBC  Recent Labs Lab 08/30/14 0424  WBC 8.7  HGB 13.4  HCT 41.1  PLT 201   ------------------------------------------------------------------------------------------------------------------  Chemistries   Recent Labs Lab 08/30/14 0424  NA 139  K 3.6  CL 106  CO2 28  GLUCOSE 84  BUN 21*  CREATININE 0.90  CALCIUM 8.5*   ------------------------------------------------------------------------------------------------------------------  Cardiac Enzymes No results for input(s): TROPONINI in the last 168 hours. ------------------------------------------------------------------------------------------------------------------  RADIOLOGY:  Dg Chest 2 View  08/29/2014   CLINICAL DATA:  Wheezing.  EXAM: CHEST  2 VIEW  COMPARISON:  03/03/2012.  FINDINGS: Borderline enlarged cardiac silhouette. Clear lungs with normal vascularity. Diffuse osteopenia.  IMPRESSION: No acute abnormality.   Electronically Signed   By: Claudie Revering M.D.   On: 08/29/2014 22:44   Dg Foot Complete Right  08/29/2014   CLINICAL DATA:  Cellulitis with right first toe wound  EXAM: RIGHT FOOT COMPLETE - 3+ VIEW  COMPARISON:  None.  FINDINGS: Frontal, oblique, and lateral views obtained. There is marked soft tissue swelling over the dorsum of the foot. There is no demonstrable air or calcification within this soft tissue fullness. There is no demonstrable fracture or dislocation. Bones are diffusely osteoporotic. Joint spaces appear intact. There is no erosive change or bony destruction.  IMPRESSION: Marked soft tissue swelling over the dorsum of the mid and forefoot. No air or calcifications seen in the swelling. Based on the history, suspect inflammatory etiology,  although a mass of neoplastic etiology is a differential consideration.  Bones are diffusely osteoporotic. No fracture or dislocation. No erosive change or bony destruction. No appreciable joint space narrowing.   Electronically Signed   By: Lowella Grip III M.D.   On: 08/29/2014 21:32    EKG:   Orders placed or performed during the hospital encounter of 03/02/12  . EKG 12-Lead  . EKG 12-Lead  . EKG 12-Lead  . EKG 12-Lead  . EKG 12-Lead  . EKG 12-Lead  . CARDIAC MONITORING STRIP  . CARDIAC MONITORING STRIP  . CARDIAC MONITORING STRIP  . CARDIAC MONITORING STRIP  . CARDIAC MONITORING STRIP  . CARDIAC MONITORING STRIP  . CARDIAC MONITORING STRIP  . EKG 12-Lead  . EKG 12-Lead  . EKG 12-Lead  . EKG 12-Lead  . CARDIAC MONITORING STRIP  . CARDIAC MONITORING STRIP  . EKG    ASSESSMENT AND PLAN:    Right Great toe diabetic foot ulcers.;continue Unasyn,podiatry consult 2.DMII;continue  Insulin with coverage,Hb a1c.continue Metformin 3,HTN;controlled 4.GERD; CKD;stage 3;stable     All the records are reviewed and case discussed with Care Management/Social Workerr. Management plans discussed with the patient, family and they are in agreement.  CODE STATUS: full  TOTAL TIME TAKING CARE OF THIS PATIENT: 53minutes.   POSSIBLE D/C IN 1-2DAYS, DEPENDING ON CLINICAL CONDITION.   Epifanio Lesches M.D on 08/30/2014 at 11:39 AM  Between 7am to 6pm - Pager - [phone]  After 6pm go to [email or web address] - password [email or web address] [facility]  [facility]  Office  [phone]  CC: Primary care physician; No primary care provider on file.

## 2014-08-30 NOTE — Plan of Care (Signed)
Problem: Discharge Progression Outcomes Goal: Other Discharge Outcomes/Goals Outcome: Progressing Pt is not NPO anymore until Podiatry sees him and decides what to do with Right great toe ulcer. Maggots reported to right toe by RN. Family at bedside. Pt to evaluate pt after consult Monitor vitals and assess pain Keep family updated with care Monitor blood sugars, pt is type 2 diabetic.

## 2014-08-30 NOTE — Care Management (Signed)
Admitted to [facility] with the diagnosis of diabetic foot ulcer. Lives alone. Daughter, Paulino Door [phone]) lives close by. Home Health about 2 years ago after being discharged from [facility]. Doesn't remember name of agency. No skilled facility. No home oxygen. Uses a rolling walker to aid in ambulation. Last Seen Dr. Laurian Brim June 23rd. Takes care of all activities of daily living himself. Daughter helps with all instrumental activities of daily living.  [place] several times last month. Physical therapy evaluation pending.  Shelbie Ammons RN MSN Care Management [phone]

## 2014-08-30 NOTE — Progress Notes (Signed)
ANTIBIOTIC CONSULT NOTE - INITIAL  Pharmacy Consult for Unasyn Indication: wound infection/diabetic foot ulcer  No Known Allergies  Patient Measurements: Height: 5\' 10"  (177.8 cm) Weight: 209 lb 1.6 oz (94.847 kg) IBW/kg (Calculated) : 73   Vital Signs: Temp: 97.6 F (36.4 C) (08/19 0502) Temp Source: Oral (08/19 0502) BP: 155/67 mmHg (08/19 0502) Pulse Rate: 62 (08/19 0502)  Labs:  Recent Labs  08/29/14 2108 08/30/14 0424  WBC 9.7 8.7  HGB 14.3 13.4  PLT 231 201  CREATININE 1.21 0.90   Estimated Creatinine Clearance: 66.8 mL/min (by C-G formula based on Cr of 0.9).  Microbiology: Recent Results (from the past 720 hour(s))  Culture, blood (routine x 2)     Status: None (Preliminary result)   Collection Time: 08/29/14  9:09 PM  Result Value Ref Range Status   Specimen Description BLOOD RIGHT ANTECUBITAL  Final   Special Requests BOTTLES DRAWN AEROBIC AND ANAEROBIC 5ML  Final   Culture NO GROWTH < 12 HOURS  Final   Report Status PENDING  Incomplete  Culture, blood (routine x 2)     Status: None (Preliminary result)   Collection Time: 08/29/14  9:09 PM  Result Value Ref Range Status   Specimen Description BLOOD BLOOD LEFT FOREARM  Final   Special Requests BOTTLES DRAWN AEROBIC AND ANAEROBIC 7ML  Final   Culture NO GROWTH < 12 HOURS  Final   Report Status PENDING  Incomplete   Assessment: Pharmacy consulted to dose unasyn for diabetic foot ulcer in this 81 year old male. Per H&P doesn't seem to have bony involvement - podiatry consulted.    Plan: Will continue unasyn 3 gm IV Q6H    Tavie Haseman C, Pharm.D. Clinical Pharmacist 08/30/2014,11:08 AM

## 2014-08-30 NOTE — Plan of Care (Signed)
Problem: Discharge Progression Outcomes Goal: Pain controlled with appropriate interventions Outcome: Progressing Pt complaints of no pain to site or injury to right great toe, awaiting consult with Dr.Cline

## 2014-08-30 NOTE — Consult Note (Signed)
Reason for Consult: Diabetic wound right great toe with some underlying maggots Referring Physician: [facility]  Roberto Griffin is an 81 y.o. male.  HPI: The patient relates a history of a draining wound on his right great toe that has been present for some time. His family has been taking care of this but recently noticed some maggots in the wound and decision was made to bring him to the emergency department for evaluation. He denies any significant history to the toe.  Past Medical History  Diagnosis Date  . Diabetes mellitus without complication   . Hypertension   . CVA (cerebral infarction)   . GERD (gastroesophageal reflux disease)   . CKD (chronic kidney disease), stage III     Past Surgical History  Procedure Laterality Date  . Cholecystectomy    . Appendectomy      Family History  Problem Relation Age of Onset  . Diabetes Mother   . Cerebral aneurysm Father     Social History:  reports that he has never smoked. He does not have any smokeless tobacco history on file. He reports that he does not drink alcohol or use illicit drugs.  Allergies: No Known Allergies  Medications: I have reviewed the patient's current medications.  Results for orders placed or performed during the hospital encounter of 08/29/14 (from the past 48 hour(s))  Basic metabolic panel     Status: Abnormal   Collection Time: 08/29/14  9:08 PM  Result Value Ref Range   Sodium 138 135 - 145 mmol/L   Potassium 4.2 3.5 - 5.1 mmol/L   Chloride 104 101 - 111 mmol/L   CO2 28 22 - 32 mmol/L   Glucose, Bld 170 (H) 65 - 99 mg/dL   BUN 23 (H) 6 - 20 mg/dL   Creatinine, Ser 1.21 0.61 - 1.24 mg/dL   Calcium 9.0 8.9 - 10.3 mg/dL   GFR calc non Af Amer 52 (L) >60 mL/min   GFR calc Af Amer >60 >60 mL/min    Comment: (NOTE) The eGFR has been calculated using the CKD EPI equation. This calculation has not been validated in all clinical situations. eGFR's persistently <60 mL/min signify possible Chronic  Kidney Disease.    Anion gap 6 5 - 15  CBC with Differential     Status: None   Collection Time: 08/29/14  9:08 PM  Result Value Ref Range   WBC 9.7 3.8 - 10.6 K/uL   RBC 5.12 4.40 - 5.90 MIL/uL   Hemoglobin 14.3 13.0 - 18.0 g/dL   HCT 44.1 40.0 - 52.0 %   MCV 86.2 80.0 - 100.0 fL   MCH 28.0 26.0 - 34.0 pg   MCHC 32.5 32.0 - 36.0 g/dL   RDW 13.2 11.5 - 14.5 %   Platelets 231 150 - 440 K/uL   Neutrophils Relative % 61 %   Neutro Abs 6.1 1.4 - 6.5 K/uL   Lymphocytes Relative 23 %   Lymphs Abs 2.2 1.0 - 3.6 K/uL   Monocytes Relative 11 %   Monocytes Absolute 1.0 0.2 - 1.0 K/uL   Eosinophils Relative 4 %   Eosinophils Absolute 0.3 0 - 0.7 K/uL   Basophils Relative 1 %   Basophils Absolute 0.1 0 - 0.1 K/uL  Hemoglobin A1c     Status: Abnormal   Collection Time: 08/29/14  9:08 PM  Result Value Ref Range   Hgb A1c MFr Bld 6.2 (H) 4.0 - 6.0 %  Culture, blood (routine x 2)  Status: None (Preliminary result)   Collection Time: 08/29/14  9:09 PM  Result Value Ref Range   Specimen Description BLOOD RIGHT ANTECUBITAL    Special Requests BOTTLES DRAWN AEROBIC AND ANAEROBIC 5ML    Culture NO GROWTH < 12 HOURS    Report Status PENDING   Culture, blood (routine x 2)     Status: None (Preliminary result)   Collection Time: 08/29/14  9:09 PM  Result Value Ref Range   Specimen Description BLOOD BLOOD LEFT FOREARM    Special Requests BOTTLES DRAWN AEROBIC AND ANAEROBIC 7ML    Culture NO GROWTH < 12 HOURS    Report Status PENDING   Glucose, capillary     Status: Abnormal   Collection Time: 08/29/14 11:45 PM  Result Value Ref Range   Glucose-Capillary 110 (H) 65 - 99 mg/dL  Basic metabolic panel     Status: Abnormal   Collection Time: 08/30/14  4:24 AM  Result Value Ref Range   Sodium 139 135 - 145 mmol/L   Potassium 3.6 3.5 - 5.1 mmol/L   Chloride 106 101 - 111 mmol/L   CO2 28 22 - 32 mmol/L   Glucose, Bld 84 65 - 99 mg/dL   BUN 21 (H) 6 - 20 mg/dL   Creatinine, Ser 0.90 0.61 -  1.24 mg/dL   Calcium 8.5 (L) 8.9 - 10.3 mg/dL   GFR calc non Af Amer >60 >60 mL/min   GFR calc Af Amer >60 >60 mL/min    Comment: (NOTE) The eGFR has been calculated using the CKD EPI equation. This calculation has not been validated in all clinical situations. eGFR's persistently <60 mL/min signify possible Chronic Kidney Disease.    Anion gap 5 5 - 15  CBC     Status: None   Collection Time: 08/30/14  4:24 AM  Result Value Ref Range   WBC 8.7 3.8 - 10.6 K/uL   RBC 4.75 4.40 - 5.90 MIL/uL   Hemoglobin 13.4 13.0 - 18.0 g/dL   HCT 41.1 40.0 - 52.0 %   MCV 86.6 80.0 - 100.0 fL   MCH 28.3 26.0 - 34.0 pg   MCHC 32.6 32.0 - 36.0 g/dL   RDW 13.2 11.5 - 14.5 %   Platelets 201 150 - 440 K/uL  Protime-INR     Status: None   Collection Time: 08/30/14  4:24 AM  Result Value Ref Range   Prothrombin Time 14.6 11.4 - 15.0 seconds   INR 1.12   APTT     Status: Abnormal   Collection Time: 08/30/14  4:24 AM  Result Value Ref Range   aPTT 42 (H) 24 - 36 seconds    Comment:        IF BASELINE aPTT IS ELEVATED, SUGGEST PATIENT RISK ASSESSMENT BE USED TO DETERMINE APPROPRIATE ANTICOAGULANT THERAPY.   Glucose, capillary     Status: None   Collection Time: 08/30/14 10:57 AM  Result Value Ref Range   Glucose-Capillary 79 65 - 99 mg/dL    Dg Chest 2 View  08/29/2014   CLINICAL DATA:  Wheezing.  EXAM: CHEST  2 VIEW  COMPARISON:  03/03/2012.  FINDINGS: Borderline enlarged cardiac silhouette. Clear lungs with normal vascularity. Diffuse osteopenia.  IMPRESSION: No acute abnormality.   Electronically Signed   By: Claudie Revering M.D.   On: 08/29/2014 22:44   Dg Foot Complete Right  08/29/2014   CLINICAL DATA:  Cellulitis with right first toe wound  EXAM: RIGHT FOOT COMPLETE - 3+ VIEW  COMPARISON:  None.  FINDINGS: Frontal, oblique, and lateral views obtained. There is marked soft tissue swelling over the dorsum of the foot. There is no demonstrable air or calcification within this soft tissue  fullness. There is no demonstrable fracture or dislocation. Bones are diffusely osteoporotic. Joint spaces appear intact. There is no erosive change or bony destruction.  IMPRESSION: Marked soft tissue swelling over the dorsum of the mid and forefoot. No air or calcifications seen in the swelling. Based on the history, suspect inflammatory etiology, although a mass of neoplastic etiology is a differential consideration.  Bones are diffusely osteoporotic. No fracture or dislocation. No erosive change or bony destruction. No appreciable joint space narrowing.   Electronically Signed   By: Lowella Grip III M.D.   On: 08/29/2014 21:32    Review of Systems  Constitutional: Negative.   HENT: Negative.   Eyes: Negative.   Respiratory: Positive for cough and wheezing.   Cardiovascular: Positive for leg swelling.  Gastrointestinal: Negative.   Genitourinary: Negative.   Musculoskeletal: Negative.   Skin:       Some redness and drainage from the right great toe. Recently noticed some maggots beneath the toenail.  Neurological: Negative.   Endo/Heme/Allergies: Negative.   Psychiatric/Behavioral: Negative.    Blood pressure 153/65, pulse 78, temperature 97.7 F (36.5 C), temperature source Oral, resp. rate 19, height 5' 10" (1.778 m), weight 94.847 kg (209 lb 1.6 oz), SpO2 96 %. Physical Exam  Cardiovascular:  DP and PT pulses are significantly diminished, barely palpable bilateral. Capillary filling time is intact.  Musculoskeletal:  Some pain on palpation and range of motion around the right great toe. Muscle strength appears normal.  Neurological:  There is loss of protective threshold with a monofilament wire to the toes and feet bilateral. Proprioception is impaired.  Skin:  The skin is warm dry and somewhat atrophic with diminished hair growth. Significant bilateral edema is present. The toenails are thick, dystrophic, discolored, brittle with some subungual debris. The lateral border of  the right hallux nail is ingrown into the nail fold with some bloody and purulent discharge. Some discoloration and erythema laterally. Some raw skin is noted at the base of the second toe. No active ulceration. Upon debridement of the toenail there are a couple of maggots are still noted along the lateral border of the nail. No deep extension through the superficial tissues is noted.    Assessment/Plan: Assessment: Paronychia with onychocryptosis right hallux with underlying maggots. Diabetes with some degree of neuropathy. Onychomycosis.  Plan: Debrided all of the toenails. Debrided out the lateral border right hallux nail with removal of the 2 remaining maggots. Betadine and a sterile gauze bandage was applied. The patient and his family member were instructed on using a gauze to cover the wound to keep the first and second toes separated. He will soak in Epsom salt water daily following for daily dressing change. Recommend oral antibiotics outpatient. We will follow him up in the office in 1-2 weeks.  Roberto Vonbehren W. 08/30/2014, 4:03 PM

## 2014-08-30 NOTE — Discharge Planning (Signed)
Pt discharged home, He is going to follow up with Dr. Jens Som outpatient in 2 weeks. Pt will pick up prescription for antibiotics at pharm. IV removed and VSS

## 2014-08-31 NOTE — Discharge Summary (Signed)
Roberto Griffin, is a 81 y.o. male  DOB 11-27-1924  MRN [id number].  Admission date:  08/29/2014  Admitting Physician  Roberto Coon, MD  Discharge Date:  08/30/2014   Primary MD  No primary care provider on file.  Recommendations for primary care physician for things to follow:   Follow up with DR Roberto Griffin .Roberto Griffin from [facility] in 2 weeks   Admission Diagnosis  foot toe inj   Discharge Diagnosis  foot toe inj    Principal Problem:   Diabetic foot ulcer Active Problems:   HTN (hypertension)   Type 2 diabetes, uncontrolled, with cellulitis of toe   GERD (gastroesophageal reflux disease)   H/O: CVA (cerebrovascular accident)   CKD (chronic kidney disease), stage Griffin      Past Medical History  Diagnosis Date  . Diabetes mellitus without complication   . Hypertension   . CVA (cerebral infarction)   . GERD (gastroesophageal reflux disease)   . CKD (chronic kidney disease), stage Griffin     Past Surgical History  Procedure Laterality Date  . Cholecystectomy    . Appendectomy         History of present illness and  Hospital Course:     Kindly see H&P for history of present illness and admission details, please review complete Labs, Consult reports and Test reports for all details in brief  HPI  from the history and physical done on the day of admission  81 year old male patient admitted for diabetic wound of the right great toe. Patient's CBC and Chem-7 were normal and the patient has been afebrile. Admitted for cellulitis and started on IV Unasyn.    Hospital Course    1: Paronychia withonychocryptosis right hallux with underlying maggots; seen by palliative artery, debridement of the toenail was done.Debrided out the lateral border right hallux nail with removal of the 2 remaining maggot. The patient and his  family member were instructed on using a gauze to cover the wound to keep the first and second toes separated. He will soak in Epsom salt water daily following for daily dressing change. Recommend oral antibiotics outpatient.  follow-up with podiatry as an outpatient in about 2 weeks. Discharge home with Keflex. Patient lab data CBC and met b were unremarkable during the hospital stay. #2 diabetes mellitus type 2: Continue home medications #3 hypertension controlled continue home medication GERD continue PPIs.  Discharge Condition: stable.   Follow UP  Follow-up Information    Follow up with Roberto W., MD In 1 week.   Specialty:  Podiatry   Contact information:   [place] [address] [phone]         Discharge Instructions  and  Discharge Medications        Medication List    TAKE these medications        amLODipine 10 MG tablet  Commonly known as:  NORVASC  Take 10 mg by mouth daily.     cephALEXin 500 MG capsule  Commonly known as:  KEFLEX  Take 1 capsule (500 mg total) by mouth 2 (two) times daily.     glyBURIDE 2.5 MG tablet  Commonly known as:  DIABETA  Take 3.75 mg by mouth daily with breakfast.     lisinopril 10 MG tablet  Commonly known as:  PRINIVIL  Take 1 tablet (10 mg total) by mouth daily.     metFORMIN 500 MG tablet  Commonly known as:  GLUCOPHAGE  Take 250 mg by  mouth daily.     metoprolol tartrate 25 MG tablet  Commonly known as:  LOPRESSOR  Take 1 tablet (25 mg total) by mouth 2 (two) times daily.     multivitamin with minerals Tabs tablet  Take 1 tablet by mouth daily.     omeprazole 20 MG capsule  Commonly known as:  PRILOSEC  Take 20 mg by mouth daily.          Diet and Activity recommendation: See Discharge Instructions above   Consults obtained - podiatry   Major procedures and Radiology Reports - PLEASE review detailed and final reports for all details, in brief -      Dg Chest 2  View  08/29/2014   CLINICAL DATA:  Wheezing.  EXAM: CHEST  2 VIEW  COMPARISON:  03/03/2012.  FINDINGS: Borderline enlarged cardiac silhouette. Clear lungs with normal vascularity. Diffuse osteopenia.  IMPRESSION: No acute abnormality.   Electronically Signed   By: Roberto Griffin M.D.   On: 08/29/2014 22:44   Dg Foot Complete Right  08/29/2014   CLINICAL DATA:  Cellulitis with right first toe wound  EXAM: RIGHT FOOT COMPLETE - 3+ VIEW  COMPARISON:  None.  FINDINGS: Frontal, oblique, and lateral views obtained. There is marked soft tissue swelling over the dorsum of the foot. There is no demonstrable air or calcification within this soft tissue fullness. There is no demonstrable fracture or dislocation. Bones are diffusely osteoporotic. Joint spaces appear intact. There is no erosive change or bony destruction.  IMPRESSION: Marked soft tissue swelling over the dorsum of the mid and forefoot. No air or calcifications seen in the swelling. Based on the history, suspect inflammatory etiology, although a mass of neoplastic etiology is a differential consideration.  Bones are diffusely osteoporotic. No fracture or dislocation. No erosive change or bony destruction. No appreciable joint space narrowing.   Electronically Signed   By: Roberto Griffin M.D.   On: 08/29/2014 21:32    Micro Results     Recent Results (from the past 240 hour(s))  Culture, blood (routine x 2)     Status: None (Preliminary result)   Collection Time: 08/29/14  9:09 PM  Result Value Ref Range Status   Specimen Description BLOOD RIGHT ANTECUBITAL  Final   Special Requests BOTTLES DRAWN AEROBIC AND ANAEROBIC 5ML  Final   Culture NO GROWTH 2 DAYS  Final   Report Status PENDING  Incomplete  Culture, blood (routine x 2)     Status: None (Preliminary result)   Collection Time: 08/29/14  9:09 PM  Result Value Ref Range Status   Specimen Description BLOOD BLOOD LEFT FOREARM  Final   Special Requests BOTTLES DRAWN AEROBIC AND  ANAEROBIC 7ML  Final   Culture NO GROWTH 2 DAYS  Final   Report Status PENDING  Incomplete       Today   Subjective:   Roberto Griffin today has no headache,no chest abdominal pain,no new weakness tingling or numbness, feels much better wants to go home today.   Objective:   Blood pressure 153/65, pulse 78, temperature 97.7 F (36.5 C), temperature source Oral, resp. rate 19, height $RemoveBe'5\' 10"'[id number]$  (1.778 m), weight 94.847 kg (209 lb 1.6 oz), SpO2 96 %.   Intake/Output Summary (Last 24 hours) at 08/31/14 1046 Last data filed at 08/30/14 1200  Gross per 24 hour  Intake    240 ml  Output    600 ml  Net   -360 ml    Exam Awake Alert, Oriented  x 3, No new F.N deficits, Normal affect [place].AT,PERRAL Supple Neck,No JVD, No cervical lymphadenopathy appriciated.  Symmetrical Chest wall movement, Good air movement bilaterally, CTAB RRR,No Gallops,Rubs or new Murmurs, No Parasternal Heave +ve B.Sounds, Abd Soft, Non tender, No organomegaly appriciated, No rebound -guarding or rigidity. No Cyanosis, Clubbing or edema, No new Rash or bruise  Data Review   CBC w Diff: Lab Results  Component Value Date   WBC 8.7 08/30/2014   HGB 13.4 08/30/2014   HCT 41.1 08/30/2014   PLT 201 08/30/2014   LYMPHOPCT 23 08/29/2014   MONOPCT 11 08/29/2014   EOSPCT 4 08/29/2014   BASOPCT 1 08/29/2014    CMP: Lab Results  Component Value Date   NA 139 08/30/2014   K 3.6 08/30/2014   CL 106 08/30/2014   CO2 28 08/30/2014   BUN 21* 08/30/2014   CREATININE 0.90 08/30/2014   PROT 7.2 03/02/2012   ALBUMIN 3.3* 03/02/2012   BILITOT 0.2* 03/02/2012   ALKPHOS 82 03/02/2012   AST 21 03/02/2012   ALT 21 03/02/2012  .   Total Time in preparing paper work, data evaluation and todays exam - 78 minutes  Hope Brandenburger M.D on 08/30/2014 at 10:46 AM

## 2014-09-03 LAB — CULTURE, BLOOD (ROUTINE X 2)
CULTURE: NO GROWTH
Culture: NO GROWTH

## 2014-10-10 ENCOUNTER — Observation Stay: Payer: Medicare Other

## 2014-10-10 ENCOUNTER — Observation Stay
Admission: EM | Admit: 2014-10-10 | Discharge: 2014-10-11 | Disposition: A | Discharge status: Skilled Nursing Facility | Payer: Medicare Other | Attending: Internal Medicine | Admitting: Internal Medicine

## 2014-10-10 ENCOUNTER — Emergency Department: Payer: Medicare Other

## 2014-10-10 ENCOUNTER — Encounter: Payer: Self-pay | Admitting: Emergency Medicine

## 2014-10-10 DIAGNOSIS — X58XXXA Exposure to other specified factors, initial encounter: Secondary | ICD-10-CM | POA: Diagnosis not present

## 2014-10-10 DIAGNOSIS — M8580 Other specified disorders of bone density and structure, unspecified site: Secondary | ICD-10-CM | POA: Diagnosis not present

## 2014-10-10 DIAGNOSIS — Z87891 Personal history of nicotine dependence: Secondary | ICD-10-CM | POA: Diagnosis not present

## 2014-10-10 DIAGNOSIS — M25551 Pain in right hip: Secondary | ICD-10-CM

## 2014-10-10 DIAGNOSIS — Y939 Activity, unspecified: Secondary | ICD-10-CM | POA: Diagnosis not present

## 2014-10-10 DIAGNOSIS — Z8673 Personal history of transient ischemic attack (TIA), and cerebral infarction without residual deficits: Secondary | ICD-10-CM | POA: Diagnosis not present

## 2014-10-10 DIAGNOSIS — Z794 Long term (current) use of insulin: Secondary | ICD-10-CM | POA: Diagnosis not present

## 2014-10-10 DIAGNOSIS — L03039 Cellulitis of unspecified toe: Secondary | ICD-10-CM | POA: Diagnosis not present

## 2014-10-10 DIAGNOSIS — S76011A Strain of muscle, fascia and tendon of right hip, initial encounter: Secondary | ICD-10-CM | POA: Diagnosis not present

## 2014-10-10 DIAGNOSIS — I129 Hypertensive chronic kidney disease with stage 1 through stage 4 chronic kidney disease, or unspecified chronic kidney disease: Secondary | ICD-10-CM | POA: Diagnosis not present

## 2014-10-10 DIAGNOSIS — N138 Other obstructive and reflux uropathy: Secondary | ICD-10-CM | POA: Insufficient documentation

## 2014-10-10 DIAGNOSIS — Z79899 Other long term (current) drug therapy: Secondary | ICD-10-CM | POA: Insufficient documentation

## 2014-10-10 DIAGNOSIS — M25559 Pain in unspecified hip: Secondary | ICD-10-CM | POA: Diagnosis present

## 2014-10-10 DIAGNOSIS — N183 Chronic kidney disease, stage 3 (moderate): Secondary | ICD-10-CM | POA: Insufficient documentation

## 2014-10-10 DIAGNOSIS — M8700 Idiopathic aseptic necrosis of unspecified bone: Secondary | ICD-10-CM

## 2014-10-10 DIAGNOSIS — Z8249 Family history of ischemic heart disease and other diseases of the circulatory system: Secondary | ICD-10-CM | POA: Diagnosis not present

## 2014-10-10 DIAGNOSIS — Z833 Family history of diabetes mellitus: Secondary | ICD-10-CM | POA: Diagnosis not present

## 2014-10-10 DIAGNOSIS — R531 Weakness: Secondary | ICD-10-CM | POA: Diagnosis not present

## 2014-10-10 DIAGNOSIS — E1310 Other specified diabetes mellitus with ketoacidosis without coma: Secondary | ICD-10-CM | POA: Insufficient documentation

## 2014-10-10 DIAGNOSIS — E1165 Type 2 diabetes mellitus with hyperglycemia: Secondary | ICD-10-CM | POA: Diagnosis not present

## 2014-10-10 DIAGNOSIS — M79606 Pain in leg, unspecified: Secondary | ICD-10-CM | POA: Insufficient documentation

## 2014-10-10 DIAGNOSIS — K219 Gastro-esophageal reflux disease without esophagitis: Secondary | ICD-10-CM | POA: Insufficient documentation

## 2014-10-10 DIAGNOSIS — E11621 Type 2 diabetes mellitus with foot ulcer: Secondary | ICD-10-CM | POA: Insufficient documentation

## 2014-10-10 DIAGNOSIS — Z9049 Acquired absence of other specified parts of digestive tract: Secondary | ICD-10-CM | POA: Insufficient documentation

## 2014-10-10 DIAGNOSIS — E1122 Type 2 diabetes mellitus with diabetic chronic kidney disease: Secondary | ICD-10-CM | POA: Insufficient documentation

## 2014-10-10 DIAGNOSIS — M1611 Unilateral primary osteoarthritis, right hip: Secondary | ICD-10-CM | POA: Insufficient documentation

## 2014-10-10 DIAGNOSIS — N401 Enlarged prostate with lower urinary tract symptoms: Secondary | ICD-10-CM | POA: Insufficient documentation

## 2014-10-10 DIAGNOSIS — M8709 Idiopathic aseptic necrosis of bone, multiple sites: Secondary | ICD-10-CM | POA: Diagnosis present

## 2014-10-10 DIAGNOSIS — Z7901 Long term (current) use of anticoagulants: Secondary | ICD-10-CM | POA: Diagnosis not present

## 2014-10-10 LAB — CBC
HCT: 45 % (ref 40.0–52.0)
HEMOGLOBIN: 14.8 g/dL (ref 13.0–18.0)
MCH: 28.5 pg (ref 26.0–34.0)
MCHC: 32.8 g/dL (ref 32.0–36.0)
MCV: 86.9 fL (ref 80.0–100.0)
PLATELETS: 256 10*3/uL (ref 150–440)
RBC: 5.18 MIL/uL (ref 4.40–5.90)
RDW: 13.8 % (ref 11.5–14.5)
WBC: 10.6 10*3/uL (ref 3.8–10.6)

## 2014-10-10 LAB — PROTIME-INR
INR: 1.07
Prothrombin Time: 14.1 seconds (ref 11.4–15.0)

## 2014-10-10 LAB — BASIC METABOLIC PANEL
Anion gap: 5 (ref 5–15)
BUN: 20 mg/dL (ref 6–20)
CHLORIDE: 103 mmol/L (ref 101–111)
CO2: 26 mmol/L (ref 22–32)
Calcium: 8.7 mg/dL — ABNORMAL LOW (ref 8.9–10.3)
Creatinine, Ser: 1.25 mg/dL — ABNORMAL HIGH (ref 0.61–1.24)
GFR calc Af Amer: 58 mL/min — ABNORMAL LOW (ref >60)
GFR calc non Af Amer: 50 mL/min — ABNORMAL LOW (ref >60)
GLUCOSE: 137 mg/dL — AB (ref 65–99)
POTASSIUM: 4.2 mmol/L (ref 3.5–5.1)
Sodium: 134 mmol/L — ABNORMAL LOW (ref 135–145)

## 2014-10-10 LAB — APTT: aPTT: 40 seconds — ABNORMAL HIGH (ref 24–36)

## 2014-10-10 LAB — GLUCOSE, CAPILLARY: Glucose-Capillary: 199 mg/dL — ABNORMAL HIGH (ref 65–99)

## 2014-10-10 MED ORDER — MORPHINE SULFATE (PF) 2 MG/ML IV SOLN
1.0000 mg | INTRAVENOUS | Status: DC | PRN
  Administered 2014-10-11: 2 mg via INTRAVENOUS
  Filled 2014-10-10: qty 1

## 2014-10-10 MED ORDER — AMLODIPINE BESYLATE 10 MG PO TABS
10.0000 mg | ORAL_TABLET | Freq: Every day | ORAL | Status: DC
  Administered 2014-10-11: 10 mg via ORAL
  Filled 2014-10-10: qty 1

## 2014-10-10 MED ORDER — INSULIN ASPART 100 UNIT/ML ~~LOC~~ SOLN: 0.0000 [IU] | Freq: Every day | SUBCUTANEOUS | Status: DC

## 2014-10-10 MED ORDER — MORPHINE SULFATE (PF) 4 MG/ML IV SOLN
4.0000 mg | Freq: Once | INTRAVENOUS | Status: AC
  Administered 2014-10-10: 4 mg via INTRAVENOUS
  Filled 2014-10-10: qty 1

## 2014-10-10 MED ORDER — ONDANSETRON HCL 4 MG/2ML IJ SOLN: 4.0000 mg | Freq: Four times a day (QID) | INTRAMUSCULAR | Status: DC | PRN

## 2014-10-10 MED ORDER — POLYETHYLENE GLYCOL 3350 17 G PO PACK: 17.0000 g | PACK | Freq: Every day | ORAL | Status: DC | PRN

## 2014-10-10 MED ORDER — MORPHINE SULFATE (PF) 4 MG/ML IV SOLN
4.0000 mg | Freq: Once | INTRAVENOUS | Status: AC
  Administered 2014-10-10: 4 mg via INTRAMUSCULAR
  Filled 2014-10-10: qty 1

## 2014-10-10 MED ORDER — ONDANSETRON HCL 4 MG PO TABS
4.0000 mg | ORAL_TABLET | Freq: Four times a day (QID) | ORAL | Status: DC | PRN
Start: 2014-10-10 — End: 2014-10-11

## 2014-10-10 MED ORDER — ADULT MULTIVITAMIN W/MINERALS CH
1.0000 | ORAL_TABLET | Freq: Every day | ORAL | Status: DC
  Administered 2014-10-11: 1 via ORAL
  Filled 2014-10-10: qty 1

## 2014-10-10 MED ORDER — GLYBURIDE 2.5 MG PO TABS
3.7500 mg | ORAL_TABLET | Freq: Every day | ORAL | Status: DC
  Administered 2014-10-11: 3.75 mg via ORAL
  Filled 2014-10-10 (×2): qty 1

## 2014-10-10 MED ORDER — OXYCODONE HCL 5 MG PO TABS
5.0000 mg | ORAL_TABLET | ORAL | Status: DC | PRN
  Administered 2014-10-10 – 2014-10-11 (×2): 5 mg via ORAL
  Filled 2014-10-10 (×2): qty 1

## 2014-10-10 MED ORDER — ACETAMINOPHEN 650 MG RE SUPP: 650.0000 mg | Freq: Four times a day (QID) | RECTAL | Status: DC | PRN

## 2014-10-10 MED ORDER — METFORMIN HCL 500 MG PO TABS
250.0000 mg | ORAL_TABLET | Freq: Every day | ORAL | Status: DC
  Administered 2014-10-11: 250 mg via ORAL
  Filled 2014-10-10: qty 1

## 2014-10-10 MED ORDER — IBUPROFEN 400 MG PO TABS
400.0000 mg | ORAL_TABLET | Freq: Once | ORAL | Status: AC
  Administered 2014-10-10: 400 mg via ORAL
  Filled 2014-10-10: qty 1

## 2014-10-10 MED ORDER — INSULIN ASPART 100 UNIT/ML ~~LOC~~ SOLN
0.0000 [IU] | Freq: Three times a day (TID) | SUBCUTANEOUS | Status: DC
  Administered 2014-10-11: 2 [IU] via SUBCUTANEOUS
  Filled 2014-10-10: qty 2

## 2014-10-10 MED ORDER — LISINOPRIL 10 MG PO TABS
10.0000 mg | ORAL_TABLET | Freq: Every day | ORAL | Status: DC
  Administered 2014-10-11: 10 mg via ORAL
  Filled 2014-10-10: qty 1

## 2014-10-10 MED ORDER — METOPROLOL TARTRATE 25 MG PO TABS
25.0000 mg | ORAL_TABLET | Freq: Two times a day (BID) | ORAL | Status: DC
  Administered 2014-10-10 – 2014-10-11 (×2): 25 mg via ORAL
  Filled 2014-10-10 (×2): qty 1

## 2014-10-10 MED ORDER — ACETAMINOPHEN 325 MG PO TABS: 650.0000 mg | ORAL_TABLET | Freq: Four times a day (QID) | ORAL | Status: DC | PRN

## 2014-10-10 MED ORDER — ENOXAPARIN SODIUM 40 MG/0.4ML ~~LOC~~ SOLN
40.0000 mg | SUBCUTANEOUS | Status: DC
  Administered 2014-10-10: 40 mg via SUBCUTANEOUS
  Filled 2014-10-10: qty 0.4

## 2014-10-10 MED ORDER — PANTOPRAZOLE SODIUM 40 MG PO TBEC
40.0000 mg | DELAYED_RELEASE_TABLET | Freq: Every day | ORAL | Status: DC
  Administered 2014-10-11: 40 mg via ORAL
  Filled 2014-10-10: qty 1

## 2014-10-10 NOTE — ED Provider Notes (Signed)
[facility] Emergency Department Provider Note  ____________________________________________  Time seen: Approximately 2:16 PM  I have reviewed the triage vital signs and the nursing notes.   HISTORY  Chief Complaint Leg Pain    HPI Roberto Griffin is a 81 y.o. male with history of diabetes, HTN, GERD, history of CVA, presents for evaluation of several weeks of worsening gradual onset aching right hip pain, constant, worse with movement and ambulation. Pain worsened yesterday and today the patient is unable to bear weight on the right foot secondary to pain. The patient and his caregiver at bedside deny any trauma or fall. He has not been taking any medications for pain. Currently his pain is severe. He was hospitalized last month for right toe infection with maggots that required debridement however this has healed well. He has otherwise had No recent illness including no cough, sneezing, runny nose, vomiting, diarrhea, fevers, chills, chest pain or shortness of breath.   Past Medical History  Diagnosis Date  . Diabetes mellitus without complication   . Hypertension   . CVA (cerebral infarction)   . GERD (gastroesophageal reflux disease)   . CKD (chronic kidney disease), stage III     Patient Active Problem List   Diagnosis Date Noted  . Hip pain 10/10/2014  . Diabetic foot ulcer 08/29/2014  . Type 2 diabetes, uncontrolled, with cellulitis of toe 08/29/2014  . GERD (gastroesophageal reflux disease) 08/29/2014  . H/O: CVA (cerebrovascular accident) 08/29/2014  . CKD (chronic kidney disease), stage III 08/29/2014  . Generalized weakness 03/03/2012  . DM (diabetes mellitus) type 2, uncontrolled, with ketoacidosis 03/03/2012  . HTN (hypertension) 03/03/2012    Past Surgical History  Procedure Laterality Date  . Cholecystectomy    . Appendectomy      Current Outpatient Rx  Name  Route  Sig  Dispense  Refill  . amLODipine (NORVASC) 10 MG tablet    Oral   Take 10 mg by mouth daily.         Marland Kitchen glyBURIDE (DIABETA) 2.5 MG tablet   Oral   Take 3.75 mg by mouth daily with breakfast.         . lisinopril (PRINIVIL) 10 MG tablet   Oral   Take 1 tablet (10 mg total) by mouth daily.   30 tablet   0   . metFORMIN (GLUCOPHAGE) 500 MG tablet   Oral   Take 250 mg by mouth daily.         . metoprolol tartrate (LOPRESSOR) 25 MG tablet   Oral   Take 1 tablet (25 mg total) by mouth 2 (two) times daily.   60 tablet   0   . Multiple Vitamin (MULTIVITAMIN WITH MINERALS) TABS   Oral   Take 1 tablet by mouth daily.         Marland Kitchen omeprazole (PRILOSEC) 20 MG capsule   Oral   Take 20 mg by mouth daily.           Allergies Review of patient's allergies indicates no known allergies.  Family History  Problem Relation Age of Onset  . Diabetes Mother   . Cerebral aneurysm Father     Social History Social History  Substance Use Topics  . Smoking status: Never Smoker   . Smokeless tobacco: None  . Alcohol Use: No    Review of Systems Constitutional: No fever/chills Eyes: No visual changes. ENT: No sore throat. Cardiovascular: Denies chest pain. Respiratory: Denies shortness of breath. Gastrointestinal: No abdominal pain.  No nausea, no vomiting.  No diarrhea.  No constipation. Genitourinary: Negative for dysuria. Musculoskeletal: Negative for back pain. Skin: Negative for rash. Neurological: Negative for headaches, focal weakness or numbness.  10-point ROS otherwise negative.  ____________________________________________   PHYSICAL EXAM:  VITAL SIGNS: ED Triage Vitals  Enc Vitals Group     BP 10/10/14 1227 145/100 mmHg     Pulse Rate 10/10/14 1227 76     Resp 10/10/14 1227 15     Temp 10/10/14 1227 97.8 F (36.6 C)     Temp Source 10/10/14 1227 Oral     SpO2 10/10/14 1227 95 %     Weight 10/10/14 1227 210 lb (95.255 kg)     Height 10/10/14 1227 5\' 10"  (1.778 m)     Head Cir --      Peak Flow --      Pain  Score 10/10/14 1251 8     Pain Loc --      Pain Edu? --      Excl. in [place]? --     Constitutional: Alert and oriented. Well appearing and in no acute distress but grimaces with any attempted movement of the right leg.. Eyes: Conjunctivae are normal. PERRL. EOMI. Head: Atraumatic. Nose: No congestion/rhinnorhea. Mouth/Throat: Mucous membranes are moist.  Oropharynx non-erythematous. Neck: No stridor.  Cardiovascular: Normal rate, regular rhythm. Grossly normal heart sounds.  Good peripheral circulation. Respiratory: Normal respiratory effort.  No retractions. Lungs CTAB. Gastrointestinal: Soft and nontender. No distention. No abdominal bruits. No CVA tenderness. Genitourinary: deferred Musculoskeletal: Pitting edema bilateral lower extremities, worse in the dorsum of the feet, tenderness to palpation throughout the right hip but pelvis is stable to rock and compression. Dopplered DP pulses bilaterally. Mild tenderness to palpation throughout the right calf. Full flexion in the right hip is only slightly limited secondary to pain. Resolved infection of the right great toe with normal tissue, no erythema, no drainage. Neurologic:  Normal speech and language. No gross focal neurologic deficits are appreciated.  Skin:  Skin is warm, dry and intact. No rash noted. Psychiatric: Mood and affect are normal. Speech and behavior are normal.  ____________________________________________   LABS (all labs ordered are listed, but only abnormal results are displayed)  Labs Reviewed  BASIC METABOLIC PANEL - Abnormal; Notable for the following:    Sodium 134 (*)    Glucose, Bld 137 (*)    Creatinine, Ser 1.25 (*)    Calcium 8.7 (*)    GFR calc non Af Amer 50 (*)    GFR calc Af Amer 58 (*)    All other components within normal limits  APTT - Abnormal; Notable for the following:    aPTT 40 (*)    All other components within normal limits  CBC  PROTIME-INR    ____________________________________________  EKG  none ____________________________________________  RADIOLOGY  Xray right hip IMPRESSION: Normal right hip. 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    Venous doppler ultrasound of right lower extremity  IMPRESSION: 1. No evidence of lower extremity deep vein thrombosis, RIGHT. ____________________________________________   PROCEDURES  Procedure(s) performed: None  Critical Care performed: No  ____________________________________________   INITIAL IMPRESSION / ASSESSMENT AND PLAN / ED COURSE  Pertinent labs & imaging results that were available during my care of the patient were reviewed by me and considered in my medical decision making (see chart for details).  Roberto Griffin is a 81 y.o. male with history of diabetes, HTN, GERD, history  of CVA, presents for evaluation of several weeks of worsening gradual onset aching right hip pain, constant, worse with movement and ambulation. On exam, he is nontoxic appearing in no acute distress. Vital signs stable, he is afebrile. Tenderness throughout the right hip but is neurovascularly intact in the right lower extremity. He does have some mild tenderness in the right calf as well. Range of motion of the right hip is slightly limited secondary to pain but his exam is not consistent with septic arthritis. Suspect his symptoms may be secondary to arthritis given they've been ongoing for several weeks however will obtain plain films of the right hip as well as Doppler ultrasound to rule out DVT. We'll treat his pain. Reassess for disposition.  ----------------------------------------- 5:33 PM on 10/10/2014 -----------------------------------------  Doppler ultrasound negative for DVT. Plain films of her bone infarct which is chronic associated with the right femur and I suspect this is the cause of his pain. At this point he has received 2  doses of morphine and is still unable to ambulate with his walker therefore discussed the case with Dr. Tressia Miners, hospitalist, for admission at this time. ____________________________________________   FINAL CLINICAL IMPRESSION(S) / ED DIAGNOSES  Final diagnoses:  Right hip pain  Hip pain, acute, right  Leg bone infarction, right      Joanne Gavel, MD 10/10/14 1757

## 2014-10-10 NOTE — ED Notes (Signed)
Pt presents with right leg and hip pain for about two weeks, no fall reports. Pt recently had infection in right foot and was treated for. Pt was unable to put weight on right leg today.

## 2014-10-10 NOTE — ED Notes (Signed)
This pt was triaged by [facility].

## 2014-10-10 NOTE — H&P (Signed)
[facility] NAME: Roberto Griffin    MR#:  [id number]  DATE OF BIRTH:  Nov 07, 1924  DATE OF ADMISSION:  10/10/2014  PRIMARY CARE PHYSICIAN: Morton Peters, MD   REQUESTING/REFERRING PHYSICIAN: Dr. Loura Pardon  CHIEF COMPLAINT:   Chief Complaint  Patient presents with  . Leg Pain    HISTORY OF PRESENT ILLNESS:  Roberto Griffin  is a 81 y.o. male with a known history of hypertension, non-insulin-dependent diabetes mellitus, history of CVA with no residual neurological deficits, CKD presents from home secondary to worsening right hip pain since yesterday. Patient has been having right hip pain going on for 2 weeks on and off. But since he is sedated has been so worse, couldn't even get up to put weight on his right hip today. No trauma noted. X-ray did not show any fracture. There is a bone infarct in the right femur head, likely chronic. Requiring multiple doses of IV pain medications in the emergency room, so being admitted for pain control and further workup.  PAST MEDICAL HISTORY:   Past Medical History  Diagnosis Date  . Diabetes mellitus without complication   . Hypertension   . CVA (cerebral infarction)   . GERD (gastroesophageal reflux disease)   . CKD (chronic kidney disease), stage III     PAST SURGICAL HISTORY:   Past Surgical History  Procedure Laterality Date  . Cholecystectomy    . Appendectomy      SOCIAL HISTORY:   Social History  Substance Use Topics  . Smoking status: Former Research scientist (life sciences)  . Smokeless tobacco: Not on file     Comment: quit 20 years ago  . Alcohol Use: No    FAMILY HISTORY:   Family History  Problem Relation Age of Onset  . Diabetes Mother   . Cerebral aneurysm Father     DRUG ALLERGIES:  No Known Allergies  REVIEW OF SYSTEMS:   Review of Systems  Unable to perform ROS: medical condition   due to pain medications given, patient is sleepy  MEDICATIONS AT HOME:    Prior to Admission medications   Medication Sig Start Date End Date Taking? Authorizing Provider  amLODipine (NORVASC) 10 MG tablet Take 10 mg by mouth daily.   Yes Historical Provider, MD  glyBURIDE (DIABETA) 2.5 MG tablet Take 3.75 mg by mouth daily with breakfast.   Yes Historical Provider, MD  lisinopril (PRINIVIL) 10 MG tablet Take 1 tablet (10 mg total) by mouth daily. 03/04/12  Yes Cletus Gash, MD  metFORMIN (GLUCOPHAGE) 500 MG tablet Take 250 mg by mouth daily.   Yes Historical Provider, MD  metoprolol tartrate (LOPRESSOR) 25 MG tablet Take 1 tablet (25 mg total) by mouth 2 (two) times daily. 03/04/12  Yes Cletus Gash, MD  Multiple Vitamin (MULTIVITAMIN WITH MINERALS) TABS Take 1 tablet by mouth daily.   Yes Historical Provider, MD  omeprazole (PRILOSEC) 20 MG capsule Take 20 mg by mouth daily.   Yes Historical Provider, MD      VITAL SIGNS:  Blood pressure 168/80, pulse 80, temperature 97.8 F (36.6 C), temperature source Oral, resp. rate 17, height 5\' 10"  (1.778 m), weight 95.255 kg (210 lb), SpO2 97 %.  PHYSICAL EXAMINATION:   Physical Exam  GENERAL:  81 y.o.-year-old elderly patient lying in the bed with no acute distress.  EYES: Pupils equal, round, reactive to light and accommodation. No scleral icterus. Extraocular muscles intact.  HEENT: Head atraumatic, normocephalic. Oropharynx and  nasopharynx clear.  NECK:  Supple, no jugular venous distention. No thyroid enlargement, no tenderness.  LUNGS: Normal breath sounds bilaterally, no wheezing, rales,rhonchi or crepitation. No use of accessory muscles of respiration. crepts heard in upper airway, lungs are clear CARDIOVASCULAR: S1, S2 normal. No rubs, or gallops. 3/6 systolic murmur present ABDOMEN: Soft, nontender, nondistended. Bowel sounds present. No organomegaly or mass.  EXTREMITIES: No cyanosis, or clubbing. 2+ pedal edema present NEUROLOGIC: Cranial nerves II through XII are intact. Generalized weakness  of all extremities noted. Patient is arousable and following commands. Right hip pain on flexion of the hip with adduction noted  PSYCHIATRIC: The patient is alert and oriented to self.  SKIN: No obvious rash, lesion, or ulcer. Dry flaky skin on both lower extremities noted. No ulcers noted.  LABORATORY PANEL:   CBC  Recent Labs Lab 10/10/14 1237  WBC 10.6  HGB 14.8  HCT 45.0  PLT 256   ------------------------------------------------------------------------------------------------------------------  Chemistries   Recent Labs Lab 10/10/14 1237  NA 134*  K 4.2  CL 103  CO2 26  GLUCOSE 137*  BUN 20  CREATININE 1.25*  CALCIUM 8.7*   ------------------------------------------------------------------------------------------------------------------  Cardiac Enzymes No results for input(s): TROPONINI in the last 168 hours. ------------------------------------------------------------------------------------------------------------------  RADIOLOGY:  US Venous Img Lower Unilateral Right  10/10/2014   CLINICAL DATA:  Pain x months, edema  EXAM: RIGHT LOWER EXTREMITY VENOUS DOPPLER ULTRASOUND  TECHNIQUE: Gray-scale sonography with compression, as well as color and duplex ultrasound, were performed to evaluate the deep venous system from the level of the common femoral vein through the popliteal and proximal calf veins.  COMPARISON:  None  FINDINGS: Normal compressibility of the common femoral, superficial femoral, and popliteal veins, as well as the proximal calf veins. No filling defects to suggest DVT on grayscale or color Doppler imaging. Doppler waveforms show normal direction of venous flow, normal respiratory phasicity and response to augmentation. Survey views of the contralateral common femoral vein are unremarkable.  IMPRESSION: 1. No evidence of lower extremity deep vein thrombosis, RIGHT.   Electronically Signed   By: Lucrezia Europe M.D.   On: 10/10/2014 15:49   Dg Hip Unilat  With Pelvis 2-3 Views Right  10/10/2014   CLINICAL DATA:  Right hip pain without injury  EXAM: DG HIP (WITH OR WITHOUT PELVIS) 2-3V RIGHT  COMPARISON:  None.  FINDINGS: Right hip joint is normal. Negative for fracture or AVN. Sclerotic changes in the medullary canal of the proximal right femur consistent with bone infarct.  No other bony lesion identified.  IMPRESSION: Normal right hip.  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   Electronically Signed   By: Franchot Gallo M.D.   On: 10/10/2014 15:59    EKG:   Orders placed or performed during the hospital encounter of 03/02/12  . EKG 12-Lead  . EKG 12-Lead  . EKG 12-Lead  . EKG 12-Lead  . EKG 12-Lead  . EKG 12-Lead  . CARDIAC MONITORING STRIP  . CARDIAC MONITORING STRIP  . CARDIAC MONITORING STRIP  . CARDIAC MONITORING STRIP  . CARDIAC MONITORING STRIP  . CARDIAC MONITORING STRIP  . CARDIAC MONITORING STRIP  . EKG 12-Lead  . EKG 12-Lead  . EKG 12-Lead  . EKG 12-Lead  . CARDIAC MONITORING STRIP  . CARDIAC MONITORING STRIP  . EKG    IMPRESSION AND PLAN:   Roberto Griffin  is a 81 y.o. male with a known history of hypertension, non-insulin-dependent diabetes mellitus, history of CVA  with no residual neurological deficits, CKD presents from home secondary to worsening right hip pain since yesterday.  #1 right hip pain-no known cause noted so far. -CT of the head to rule out any occult fracture. -Orthopedic consult. -Ambulates with walker at baseline, physical therapy consult. -IV pain medications. -Doppler of lower extremities negative for any DVT  #2 hypertension-continue on Norvasc and lisinopril  #3 diabetes mellitus-patient on metformin and glyburide. Also added sliding scale insulin  #4 GERD-continue PPI  #5 DVT prophylaxis-on Lovenox  All the records are reviewed and case discussed with ED provider. Management plans discussed with the patient, family and they are in agreement.  CODE  STATUS: DO NOT RESUSCITATE-discussed with daughter at bedside  TOTAL TIME TAKING CARE OF THIS PATIENT: 50 minutes.    Gladstone Lighter M.D on 10/10/2014 at 5:57 PM  Between 7am to 6pm - Pager - [phone]  After 6pm go to [email or web address] - password [email or web address] [facility]  Office  [phone]  CC: Primary care physician; Morton Peters, MD

## 2014-10-11 ENCOUNTER — Observation Stay: Payer: Medicare Other

## 2014-10-11 LAB — BASIC METABOLIC PANEL
Anion gap: 4 — ABNORMAL LOW (ref 5–15)
BUN: 20 mg/dL (ref 6–20)
CALCIUM: 8.8 mg/dL — AB (ref 8.9–10.3)
CO2: 30 mmol/L (ref 22–32)
CREATININE: 0.89 mg/dL (ref 0.61–1.24)
Chloride: 107 mmol/L (ref 101–111)
GFR calc Af Amer: >60 mL/min (ref >60)
GLUCOSE: 89 mg/dL (ref 65–99)
POTASSIUM: 4 mmol/L (ref 3.5–5.1)
SODIUM: 141 mmol/L (ref 135–145)

## 2014-10-11 LAB — CBC
HCT: 40.1 % (ref 40.0–52.0)
Hemoglobin: 13.4 g/dL (ref 13.0–18.0)
MCH: 28.6 pg (ref 26.0–34.0)
MCHC: 33.3 g/dL (ref 32.0–36.0)
MCV: 86 fL (ref 80.0–100.0)
PLATELETS: 213 10*3/uL (ref 150–440)
RBC: 4.67 MIL/uL (ref 4.40–5.90)
RDW: 13.5 % (ref 11.5–14.5)
WBC: 8.7 10*3/uL (ref 3.8–10.6)

## 2014-10-11 LAB — HEMOGLOBIN A1C: HEMOGLOBIN A1C: 6.3 % — AB (ref 4.0–6.0)

## 2014-10-11 LAB — GLUCOSE, CAPILLARY
GLUCOSE-CAPILLARY: 103 mg/dL — AB (ref 65–99)
Glucose-Capillary: 83 mg/dL (ref 65–99)
Glucose-Capillary: 172 mg/dL — ABNORMAL HIGH (ref 65–99)

## 2014-10-11 MED ORDER — OXYCODONE HCL 5 MG PO TABS: 5.0000 mg | ORAL_TABLET | ORAL | Status: DC | PRN

## 2014-10-11 MED ORDER — POLYETHYLENE GLYCOL 3350 17 G PO PACK: 17.0000 g | PACK | Freq: Every day | ORAL | Status: AC | PRN

## 2014-10-11 MED ORDER — GADOBENATE DIMEGLUMINE 529 MG/ML IV SOLN
20.0000 mL | Freq: Once | INTRAVENOUS | Status: AC | PRN
  Administered 2014-10-11: 20 mL via INTRAVENOUS

## 2014-10-11 NOTE — Progress Notes (Signed)
Clinical Social Worker (CSW) met with patient and his daughter [name] and son Lanny Hurst and daughter in law were at bedside. CSW explained SNF process and presented bed offers. [name] chose [facility]. Doug admissions coordinator at [facility] is aware of accepted offer. CSW also discussed long term care options with daughter including private pay and Medicaid. CSW provided information for Medicaid to daughter. Daughter expressed that she did not want patient to have surgery because "he is too weak." CSW made MD aware that if patient is ready for D/C today or over the weekend the D/C Summary will have to be completed by 2:30 pm today. CSW will continue to follow and assist as needed.   Blima Rich, [place] [phone]

## 2014-10-11 NOTE — Discharge Instructions (Signed)
Heart healthy and ADA diet. Activity as tolerated. Fall precaution. PT.

## 2014-10-11 NOTE — Discharge Summary (Signed)
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 BPH  SECONDARY DIAGNOSIS:   Past Medical History  Diagnosis Date  . Diabetes mellitus without complication   . Hypertension   . CVA (cerebral infarction)   . GERD (gastroesophageal reflux disease)   . 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 The patient got pain control with morphine IV and oxycodone when necessary. PT evaluation suggested a skilled nursing facility placement. Doppler of lower extremities negative for any DVT. MRI showstear of the right iliacus musculotendinous junction. Also showed enlarged prostate. I discussed with Dr. Roland Rack, orthopedic surgeon, who suggested patient can be discharged to skilled nursing facility and continue PT.   #2 hypertension-continue on Norvasc and lisinopril  #3 diabetes mellitus-patient on metformin and glyburide and sliding scale insulin   DISCHARGE CONDITIONS:   Stable, discharged to skilled nursing facility today.  CONSULTS OBTAINED:  Treatment Team:  Corky Mull, MD  DRUG ALLERGIES:  No Known Allergies  DISCHARGE MEDICATIONS:   Current Discharge Medication List    START taking these medications   Details  oxyCODONE (OXY IR/ROXICODONE) 5 MG immediate release tablet Take 1 tablet (5 mg total) by mouth every 4 (four) hours as needed for moderate pain. Qty: 20 tablet, Refills: 0    polyethylene glycol  (MIRALAX / GLYCOLAX) packet Take 17 g by mouth daily as needed for mild constipation. Qty: 14 each, Refills: 0      CONTINUE these medications which have NOT CHANGED   Details  amLODipine (NORVASC) 10 MG tablet Take 10 mg by mouth daily.    glyBURIDE (DIABETA) 2.5 MG tablet Take 3.75 mg by mouth daily with breakfast.    lisinopril (PRINIVIL) 10 MG tablet Take 1 tablet (10 mg total) by mouth daily. Qty: 30 tablet, Refills: 0    metFORMIN (GLUCOPHAGE) 500 MG tablet Take 250 mg by mouth daily.    metoprolol tartrate (LOPRESSOR) 25 MG tablet Take 1 tablet (25 mg total) by mouth 2 (two) times daily. Qty: 60 tablet, Refills: 0    Multiple Vitamin (MULTIVITAMIN WITH MINERALS) TABS Take 1 tablet by mouth daily.    omeprazole (PRILOSEC) 20 MG capsule Take 20 mg by mouth daily.         DISCHARGE INSTRUCTIONS:    If you experience worsening of your admission symptoms, develop shortness of breath, life threatening emergency, suicidal or homicidal thoughts you must seek medical attention immediately by calling 911 or calling your MD immediately  if symptoms less severe.  You Must read complete instructions/literature along with all the possible adverse reactions/side effects for all the Medicines you take and that have been prescribed to you. Take any new Medicines after you have completely understood and accept all the possible adverse reactions/side effects.   Please note  You were cared for by a hospitalist during your hospital stay. If you have any questions about your discharge medications or the care you received while you were  in the hospital after you are discharged, you can call the unit and asked to speak with the hospitalist on call if the hospitalist that took care of you is not available. Once you are discharged, your primary care physician will handle any further medical issues. Please note that NO REFILLS for any discharge medications will be authorized once you are discharged,  as it is imperative that you return to your primary care physician (or establish a relationship with a primary care physician if you do not have one) for your aftercare needs so that they can reassess your need for medications and monitor your lab values.    Today   SUBJECTIVE   Mild right hip pain.   VITAL SIGNS:  Blood pressure 147/75, pulse 74, temperature 97.5 F (36.4 C), temperature source Oral, resp. rate 20, height 5\' 10"  (1.778 m), weight 97.932 kg (215 lb 14.4 oz), SpO2 95 %.  I/O:   Intake/Output Summary (Last 24 hours) at 10/11/14 1426 Last data filed at 10/11/14 1220  Gross per 24 hour  Intake    360 ml  Output   1221 ml  Net   -861 ml    PHYSICAL EXAMINATION:  GENERAL:  81 y.o.-year-old patient lying in the bed with no acute distress.  EYES: Pupils equal, round, reactive to light and accommodation. No scleral icterus. Extraocular muscles intact.  HEENT: Head atraumatic, normocephalic. Oropharynx and nasopharynx clear.  NECK:  Supple, no jugular venous distention. No thyroid enlargement, no tenderness.  LUNGS: Normal breath sounds bilaterally, no wheezing, rales,rhonchi or crepitation. No use of accessory muscles of respiration.  CARDIOVASCULAR: S1, S2 normal. No murmurs, rubs, or gallops.  ABDOMEN: Soft, non-tender, non-distended. Bowel sounds present. No organomegaly or mass.  EXTREMITIES: No pedal edema, cyanosis, or clubbing.  NEUROLOGIC: Cranial nerves II through XII are intact. Muscle strength 4/5 in all extremities except right lower extremities 3/5. Sensation intact. Gait not checked.  PSYCHIATRIC: The patient is alert and oriented x 3.  SKIN: No obvious rash, lesion, or ulcer.   DATA REVIEW:   CBC  Recent Labs Lab 10/11/14 0401  WBC 8.7  HGB 13.4  HCT 40.1  PLT 213    Chemistries   Recent Labs Lab 10/11/14 0401  NA 141  K 4.0  CL 107  CO2 30  GLUCOSE 89  BUN 20  CREATININE 0.89  CALCIUM 8.8*    Cardiac Enzymes No results for  input(s): TROPONINI in the last 168 hours.  Microbiology Results  Results for orders placed or performed during the hospital encounter of 08/29/14  Culture, blood (routine x 2)     Status: None   Collection Time: 08/29/14  9:09 PM  Result Value Ref Range Status   Specimen Description BLOOD RIGHT ANTECUBITAL  Final   Special Requests BOTTLES DRAWN AEROBIC AND ANAEROBIC 5ML  Final   Culture NO GROWTH 5 DAYS  Final   Report Status 09/03/2014 FINAL  Final  Culture, blood (routine x 2)     Status: None   Collection Time: 08/29/14  9:09 PM  Result Value Ref Range Status   Specimen Description BLOOD BLOOD LEFT FOREARM  Final   Special Requests BOTTLES DRAWN AEROBIC AND ANAEROBIC 7ML  Final   Culture NO GROWTH 5 DAYS  Final   Report Status 09/03/2014 FINAL  Final    RADIOLOGY:  Dg Lumbar Spine 2-3 Views  10/10/2014   CLINICAL DATA:  Right hip pain  EXAM: LUMBAR SPINE - 2-3 VIEW  COMPARISON:  None.  FINDINGS: No evidence of acute fracture, bone lesion, or endplate erosion. Normal spinal alignment. Mild facet arthropathy with overgrowth at L5-S1. No disc narrowing.  Full urinary bladder, also seen on the previous CT where there is prostate enlargement.  Osteopenia.  IMPRESSION: 1. No acute finding or significant degenerative change. 2. Distended bladder with prostatomegaly on previous CT.   Electronically Signed   By: Monte Fantasia M.D.   On: 10/10/2014 22:15   Mr Hip Right W Wo Contrast  10/11/2014   CLINICAL DATA:  Right hip pain for 2 weeks. Pain with weight-bearing.  EXAM: MRI OF THE RIGHT HIP WITHOUT AND WITH CONTRAST  TECHNIQUE: Multiplanar, multisequence MR imaging was performed both before and after administration of intravenous contrast.  CONTRAST:  75mL MULTIHANCE GADOBENATE DIMEGLUMINE 529 MG/ML IV SOLN  COMPARISON:  None.  FINDINGS: Bones: No focal marrow signal abnormality. No fracture, dislocation or avascular necrosis. Normal sacrum and sacroiliac joints.  Articular cartilage and  labrum  Articular cartilage: Mild chondromalacia, but evaluation is limited secondary to large field of view.  Labrum:  The no gross labral or paralabral abnormality.  Joint or bursal effusion  Joint effusion:  No significant joint effusion.  Bursae:  No bursa formation.  Muscles and tendons  Flexors: High-grade partial-thickness tear of the right iliacus musculotendinous junction. The left iliopsoas tendon is intact.  Extensors: Normal.  Abductors: Normal.  Adductors: Normal.  Rotators: Normal.  Hamstrings: Normal.  Gluteals: Mild edema within the peripheral aspect of the right gluteus maximus at the trochanteric insertion likely reflecting mild muscle strain. Remainder of the gluteal musculature is normal.  Other findings  Miscellaneous: Prostatic enlargement measuring 5.9 x 5.2 cm in axial dimension. Prostate impresses upon the bladder base. Mild bladder wall irregularity which may reflect a component of bladder outlet obstruction. No inguinal lymphadenopathy. No inguinal hernia. No pelvic free fluid.  IMPRESSION: 1. No hip fracture, dislocation or avascular necrosis. 2. High-grade partial-thickness tear of the right iliacus musculotendinous junction. 3. Prostatic enlargement impressing upon the bladder base and with mild bladder wall irregularity likely reflecting a component of bladder outlet obstruction.   Electronically Signed   By: Kathreen Devoid   On: 10/11/2014 14:00   Ct Hip Right Wo Contrast  10/10/2014   CLINICAL DATA:  Right leg and hip pain for 2 weeks. No known injury. Unable to bear weight.  EXAM: CT OF THE RIGHT HIP WITHOUT CONTRAST  TECHNIQUE: Multidetector CT imaging of the right hip was performed according to the standard protocol. Multiplanar CT image reconstructions were also generated.  COMPARISON:  Radiographs earlier this day.  FINDINGS: No acute fracture. Femoral head is seated in the acetabulum. Minimal osteoarthritis with small acetabular osteophytes, the joint space is preserved. Small  geographic/popcorn calcifications involving the right proximal femoral shaft, may reflect enchondroma versus bone infarct. A tiny focus of calcification is also seen in the femoral head, no extension to the articular surface. No erosion, periosteal reaction, or bony destructive change.  No significant hip joint effusion. Multiple small lymph nodes in the right groin and right external iliac chain, no pathologic adenopathy. Vascular calcifications are seen. Incidental note of enlarged prostate gland (6.0 x 5.8 cm biaxial dimension) causing mass effect on the bladder base.  IMPRESSION: 1. Bone infarct versus enchondroma involving the right proximal femur, with a small focus in the femoral head. There is no extension to the articular surface or findings of avascular necrosis. This is likely chronic/incidental. 2. Mild osteoarthritis of the right hip. 3. Incidental  note of enlarged prostate gland causing mass effect on the bladder base.   Electronically Signed   By: Jeb Levering M.D.   On: 10/10/2014 18:38   US Venous Img Lower Unilateral Right  10/10/2014   CLINICAL DATA:  Pain x months, edema  EXAM: RIGHT LOWER EXTREMITY VENOUS DOPPLER ULTRASOUND  TECHNIQUE: Gray-scale sonography with compression, as well as color and duplex ultrasound, were performed to evaluate the deep venous system from the level of the common femoral vein through the popliteal and proximal calf veins.  COMPARISON:  None  FINDINGS: Normal compressibility of the common femoral, superficial femoral, and popliteal veins, as well as the proximal calf veins. No filling defects to suggest DVT on grayscale or color Doppler imaging. Doppler waveforms show normal direction of venous flow, normal respiratory phasicity and response to augmentation. Survey views of the contralateral common femoral vein are unremarkable.  IMPRESSION: 1. No evidence of lower extremity deep vein thrombosis, RIGHT.   Electronically Signed   By: Lucrezia Europe M.D.   On:  10/10/2014 15:49   Dg Hip Unilat With Pelvis 2-3 Views Right  10/10/2014   CLINICAL DATA:  Right hip pain without injury  EXAM: DG HIP (WITH OR WITHOUT PELVIS) 2-3V RIGHT  COMPARISON:  None.  FINDINGS: Right hip joint is normal. Negative for fracture or AVN. Sclerotic changes in the medullary canal of the proximal right femur consistent with bone infarct.  No other bony lesion identified.  IMPRESSION: Normal right hip.  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   Electronically Signed   By: Franchot Gallo M.D.   On: 10/10/2014 15:59        Management plans discussed with the patient, family and they are in agreement.  CODE STATUS:     Code Status Orders        Start     Ordered   10/10/14 2005  Do not attempt resuscitation (DNR)   Continuous    Question Answer Comment  In the event of cardiac or respiratory ARREST Do not call a "code blue"   In the event of cardiac or respiratory ARREST Do not perform Intubation, CPR, defibrillation or ACLS   In the event of cardiac or respiratory ARREST Use medication by any route, position, wound care, and other measures to relive pain and suffering. May use oxygen, suction and manual treatment of airway obstruction as needed for comfort.      10/10/14 2004    Advance Directive Documentation        Most Recent Value   Type of Advance Directive  Living will   Pre-existing out of facility DNR order (yellow form or pink MOST form)     "MOST" Form in Place?        TOTAL TIME TAKING CARE OF THIS PATIENT: 42 minutes.    Demetrios Loll M.D on 10/11/2014 at 2:26 PM  Between 7am to 6pm - Pager - [phone]  After 6pm go to [email or web address] - password [email or web address] [facility]  Office  [phone]  CC: Primary care physician; Morton Peters, MD

## 2014-10-11 NOTE — Progress Notes (Signed)
   10/11/14 1000  Clinical Encounter Type  Visited With Patient  Visit Type Initial  Consult/Referral To Chaplain  Chaplain rounded in unit and offered pastoral care.   Chaplain Brianna Headen [phone]

## 2014-10-11 NOTE — Consult Note (Signed)
ORTHOPAEDIC CONSULTATION  REQUESTING PHYSICIAN: Demetrios Loll, MD  Chief Complaint:   Right hip pain  History of Present Illness: Roberto Griffin is a 81 y.o. male with a history of diabetes, hypertension, stroke, gastroesophageal reflux disease, and chronic kidney disease who apparently lives at home. The patient notes that he's been having some right hip pain for the past several weeks, but this worsened over the past 24 hours, which precipitated his visit to the emergency room. Although his plain x-rays were unremarkable, he had significant pain and was unable to bear weight, prompting his admission for pain control and further workup. The patient denies any history of injury to the hip and denies any recent falls. The patient denies any numbness or paresthesias down his leg. He also denies any bowel or bladder complaints.  Past Medical History  Diagnosis Date  . Diabetes mellitus without complication   . Hypertension   . CVA (cerebral infarction)   . GERD (gastroesophageal reflux disease)   . CKD (chronic kidney disease), stage III    Past Surgical History  Procedure Laterality Date  . Cholecystectomy    . Appendectomy     Social History   Social History  . Marital Status: Married    Spouse Name: N/A  . Number of Children: N/A  . Years of Education: N/A   Social History Main Topics  . Smoking status: Former Research scientist (life sciences)  . Smokeless tobacco: None     Comment: quit 20 years ago  . Alcohol Use: No  . Drug Use: No  . Sexual Activity: Not Asked   Other Topics Concern  . None   Social History Narrative   Lives at home by himself    Has a walker   Family History  Problem Relation Age of Onset  . Diabetes Mother   . Cerebral aneurysm Father    No Known Allergies Prior to Admission medications   Medication Sig Start Date End Date Taking? Authorizing Provider  amLODipine (NORVASC) 10 MG tablet Take 10 mg by mouth  daily.   Yes Historical Provider, MD  glyBURIDE (DIABETA) 2.5 MG tablet Take 3.75 mg by mouth daily with breakfast.   Yes Historical Provider, MD  lisinopril (PRINIVIL) 10 MG tablet Take 1 tablet (10 mg total) by mouth daily. 03/04/12  Yes Cletus Gash, MD  metFORMIN (GLUCOPHAGE) 500 MG tablet Take 250 mg by mouth daily.   Yes Historical Provider, MD  metoprolol tartrate (LOPRESSOR) 25 MG tablet Take 1 tablet (25 mg total) by mouth 2 (two) times daily. 03/04/12  Yes Cletus Gash, MD  Multiple Vitamin (MULTIVITAMIN WITH MINERALS) TABS Take 1 tablet by mouth daily.   Yes Historical Provider, MD  omeprazole (PRILOSEC) 20 MG capsule Take 20 mg by mouth daily.   Yes Historical Provider, MD   Dg Lumbar Spine 2-3 Views  10/10/2014   CLINICAL DATA:  Right hip pain  EXAM: LUMBAR SPINE - 2-3 VIEW  COMPARISON:  None.  FINDINGS: No evidence of acute fracture, bone lesion, or endplate erosion. Normal spinal alignment. Mild facet arthropathy with overgrowth at L5-S1. No disc narrowing.  Full urinary bladder, also seen on the previous CT where there is prostate enlargement.  Osteopenia.  IMPRESSION: 1. No acute finding or significant degenerative change. 2. Distended bladder with prostatomegaly on previous CT.   Electronically Signed   By: Monte Fantasia M.D.   On: 10/10/2014 22:15   Ct Hip Right Wo Contrast  10/10/2014   CLINICAL DATA:  Right leg and hip pain  for 2 weeks. No known injury. Unable to bear weight.  EXAM: CT OF THE RIGHT HIP WITHOUT CONTRAST  TECHNIQUE: Multidetector CT imaging of the right hip was performed according to the standard protocol. Multiplanar CT image reconstructions were also generated.  COMPARISON:  Radiographs earlier this day.  FINDINGS: No acute fracture. Femoral head is seated in the acetabulum. Minimal osteoarthritis with small acetabular osteophytes, the joint space is preserved. Small geographic/popcorn calcifications involving the right proximal femoral shaft, may reflect  enchondroma versus bone infarct. A tiny focus of calcification is also seen in the femoral head, no extension to the articular surface. No erosion, periosteal reaction, or bony destructive change.  No significant hip joint effusion. Multiple small lymph nodes in the right groin and right external iliac chain, no pathologic adenopathy. Vascular calcifications are seen. Incidental note of enlarged prostate gland (6.0 x 5.8 cm biaxial dimension) causing mass effect on the bladder base.  IMPRESSION: 1. Bone infarct versus enchondroma involving the right proximal femur, with a small focus in the femoral head. There is no extension to the articular surface or findings of avascular necrosis. This is likely chronic/incidental. 2. Mild osteoarthritis of the right hip. 3. Incidental note of enlarged prostate gland causing mass effect on the bladder base.   Electronically Signed   By: Jeb Levering M.D.   On: 10/10/2014 18:38   US Venous Img Lower Unilateral Right  10/10/2014   CLINICAL DATA:  Pain x months, edema  EXAM: RIGHT LOWER EXTREMITY VENOUS DOPPLER ULTRASOUND  TECHNIQUE: Gray-scale sonography with compression, as well as color and duplex ultrasound, were performed to evaluate the deep venous system from the level of the common femoral vein through the popliteal and proximal calf veins.  COMPARISON:  None  FINDINGS: Normal compressibility of the common femoral, superficial femoral, and popliteal veins, as well as the proximal calf veins. No filling defects to suggest DVT on grayscale or color Doppler imaging. Doppler waveforms show normal direction of venous flow, normal respiratory phasicity and response to augmentation. Survey views of the contralateral common femoral vein are unremarkable.  IMPRESSION: 1. No evidence of lower extremity deep vein thrombosis, RIGHT.   Electronically Signed   By: Lucrezia Europe M.D.   On: 10/10/2014 15:49   Dg Hip Unilat With Pelvis 2-3 Views Right  10/10/2014   CLINICAL DATA:   Right hip pain without injury  EXAM: DG HIP (WITH OR WITHOUT PELVIS) 2-3V RIGHT  COMPARISON:  None.  FINDINGS: Right hip joint is normal. Negative for fracture or AVN. Sclerotic changes in the medullary canal of the proximal right femur consistent with bone infarct.  No other bony lesion identified.  IMPRESSION: Normal right hip.  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   Electronically Signed   By: Franchot Gallo M.D.   On: 10/10/2014 15:59    Positive ROS: All other systems have been reviewed and were otherwise negative with the exception of those mentioned in the HPI and as above.  Physical Exam: General:  Alert, no acute distress, but poor historian Psychiatric:  Patient is competent for consent with normal mood and affect   Cardiovascular:  No pedal edema Respiratory:  No wheezing, non-labored breathing GI:  Abdomen is soft and non-tender Skin:  No lesions in the area of chief complaint Neurologic:  Sensation intact distally Lymphatic:  No axillary or cervical lymphadenopathy  Orthopedic Exam:  Orthopedic examination is limited to the right hip and lower extremity. Skin inspection demonstrates evidence  of chronic fungal/yeast infection in the groin creases anteriorly, but otherwise is unremarkable. He has tenderness to palpation in the anterior hip region to even light touch, but no tenderness laterally over the trochanteric region. He has no pain with logrolling of the leg, but does have pain with active hip flexion. He is able to extend his knee without pain and can tolerate passive hip flexion to 90 with only mild discomfort. He does have some discomfort at the extremes of internal and external rotation at 90 of hip flexion. He is able to dorsiflex and plantarflex his toes without difficulty. He has intact sensation to light touch to all distributions, and good capillary refill to his toes.  X-rays:  X-rays of the pelvis and right hip are available for  review. These films demonstrate mild early degenerative changes but no evidence for fractures or other acute processes. There is evidence of sclerotic areas of the bone in the metaphyseal region at the level of the lesser trochanter consistent with bone infarcts which most likely is a chronic condition.   X-rays of the lumbar spine also are available for review. These films demonstrate no evidence for fractures, lytic lesions, or significant degenerative changes. There is no evidence of spondylolysis or spondylolisthesis.  A recent CT scan of the right hip also is available for review. This study demonstrates no evidence for fractures, lytic lesions, or significant degenerative changes. Again, the areas of sclerosis are noted in the metaphyseal region and again are interpreted by the radiologist as consistent with bone infarct versus enchondroma, but there is no evidence for any cortical erosion to suggest a malignant transformation of the possible enchondroma.  Assessment: Anterior right hip/groin pain of unclear etiology. Possible hip flexor strain.  Plan: The treatment options are discussed with the patient. The patient is a poor historian and it is difficult to extract information from him. Given the benign appearance of the x-rays and CT scan, most likely is suffering from a hip flexor strain. Other possibilities would include non-orthopedic issues such as a small inguinal hernia. At this point, I would suggest that an attempt be made to mobilize the patient with physical therapy over the next several days. If he is unable to tolerate physical therapy, an MRI scan of the right hip to better evaluate the soft tissues would be indicated to better elucidate the etiology of his symptoms as well as to verify that there is no malignant transformation of the lesion in the metaphyseal region which might be contributing to his symptoms.  Thank you for ask me to participate in the care of this patient. I will  be happy to follow him with you.   Pascal Lux, MD  Beeper #:  [phone]  10/11/2014 8:16 AM

## 2014-10-11 NOTE — Progress Notes (Signed)
Patient discharging to [facility]. IV removed. Report called to facility. Family at bedside. Transportation will be provided by patients son. Waiting on son to be here after 5pm. Continue to monitor.

## 2014-10-11 NOTE — Clinical Social Work Placement (Signed)
   CLINICAL SOCIAL WORK PLACEMENT  NOTE  Date:  10/11/2014  Patient Details  Name: Roberto Griffin MRN: [id number] Date of Birth: 20-Feb-1924  Clinical Social Work is seeking post-discharge placement for this patient at the [place] level of care (*CSW will initial, date and re-position this form in  chart as items are completed):  Yes   Patient/family provided with [facility] Work Department's list of facilities offering this level of care within the geographic area requested by the patient (or if unable, by the patient's family).  Yes   Patient/family informed of their freedom to choose among providers that offer the needed level of care, that participate in Medicare, Medicaid or managed care program needed by the patient, have an available bed and are willing to accept the patient.  Yes   Patient/family informed of [facility]'s ownership interest in [facility] and [facility], as well as of the fact that they are under no obligation to receive care at these facilities.  PASRR submitted to EDS on 10/11/14     PASRR number received on 10/11/14     Existing PASRR number confirmed on       FL2 transmitted to all facilities in geographic area requested by pt/family on 10/11/14     FL2 transmitted to all facilities within larger geographic area on       Patient informed that his/her managed care company has contracts with or will negotiate with certain facilities, including the following:        Yes   Patient/family informed of bed offers received.  Patient chooses bed at  [facility] )     Physician recommends and patient chooses bed at      Patient to be transferred to  [facility] ) on 10/11/14.  Patient to be transferred to facility by  [facility] )     Patient family notified on 10/11/14 of transfer.  Name of family member notified:   (Daughter [name] is aware of D/C. )     PHYSICIAN       Additional  Comment:    _______________________________________________ Loralyn Freshwater, LCSW 10/11/2014, 3:07 PM

## 2014-10-11 NOTE — Evaluation (Signed)
Physical Therapy Evaluation Patient Details Name: Roberto Griffin MRN: [id number] DOB: Mar 13, 1924 Today's Date: 10/11/2014   History of Present Illness  Pt here with R hip pain, no reported trauma  Clinical Impression  Pt apparently has been using a walker more recently and (per granddaughter) he did not use it appropriately keeping it too far ahead.  He was impulsive during standing (reaching for window sill and chair) and needs constant cuing to insure relative safety and to keep him upright.  He was very functionally limited.     Follow Up Recommendations SNF    Equipment Recommendations       Recommendations for Other Services       Precautions / Restrictions Precautions Precautions: Fall Restrictions Weight Bearing Restrictions: No Other Position/Activity Restrictions: pt hesitant to put weight on the R LE      Mobility  Bed Mobility Overal bed mobility: Needs Assistance Bed Mobility: Supine to Sit     Supine to sit: Max assist;Mod assist     General bed mobility comments: Pt does show effort, but is very limited with how much he can assist  Transfers Overall transfer level: Needs assistance Equipment used: Rolling walker (2 wheeled) Transfers: Sit to/from Stand Sit to Stand: Max assist         General transfer comment: Pt needed a lot of assist to get to standing, does maintain static standing with walker with only min assist, but reached out to window sill and generally showed impulsiveness  Ambulation/Gait Ambulation/Gait assistance: Max assist Ambulation Distance (Feet): 2 Feet Assistive device: Rolling walker (2 wheeled)       General Gait Details: Pt is impulsive, unsafe, does not bear weight on the R well and generally is unsafe and   Stairs            Wheelchair Mobility    Modified Rankin (Stroke Patients Only)       Balance                                             Pertinent Vitals/Pain Pain Assessment:  0-10 Pain Score: 7  (at rest, increases with movement)    Home Living Family/patient expects to be discharged to:: Skilled nursing facility Living Arrangements: Alone                    Prior Function Level of Independence: Needs assistance (daughter helps, uses SPC at baseline)               Hand Dominance        Extremity/Trunk Assessment   Upper Extremity Assessment: Overall WFL for tasks assessed           Lower Extremity Assessment: RLE deficits/detail RLE Deficits / Details: pt with essentially no AROM in R LE, very pain limited and cautious       Communication   Communication: No difficulties  Cognition Arousal/Alertness: Awake/alert Behavior During Therapy: Impulsive Overall Cognitive Status: Within Functional Limits for tasks assessed                      General Comments      Exercises        Assessment/Plan    PT Assessment Patient needs continued PT services  PT Diagnosis Difficulty walking;Acute pain;Generalized weakness   PT Problem List Decreased strength;Decreased activity tolerance;Decreased balance;Decreased mobility;Decreased safety  awareness;Decreased coordination  PT Treatment Interventions Gait training;Therapeutic activities;Therapeutic exercise;Functional mobility training;Balance training   PT Goals (Current goals can be found in the Care Plan section) Acute Rehab PT Goals Patient Stated Goal: "I have to figure with pain out" PT Goal Formulation: With patient Time For Goal Achievement: 10/25/14 Potential to Achieve Goals: Fair    Frequency Min 2X/week   Barriers to discharge        Co-evaluation               End of Session Equipment Utilized During Treatment: Gait belt Activity Tolerance: Patient limited by pain Patient left: with chair alarm set Nurse Communication: Mobility status    Functional Assessment Tool Used: clinical judgement Functional Limitation: Mobility: Walking and moving  around Mobility: Walking and Moving Around Current Status [phone]): At least 80 percent but less than 100 percent impaired, limited or restricted Mobility: Walking and Moving Around Goal Status [phone]): At least 20 percent but less than 40 percent impaired, limited or restricted    Time: 9379-0240 PT Time Calculation (min) (ACUTE ONLY): 28 min   Charges:   PT Evaluation $Initial PT Evaluation Tier I: 1 Procedure     PT G Codes:   PT G-Codes **NOT FOR INPATIENT CLASS** Functional Assessment Tool Used: clinical judgement Functional Limitation: Mobility: Walking and moving around Mobility: Walking and Moving Around Current Status ([phone]): At least 80 percent but less than 100 percent impaired, limited or restricted Mobility: Walking and Moving Around Goal Status [phone]): At least 20 percent but less than 40 percent impaired, limited or restricted   Wayne Both, PT, DPT [phone]  Kreg Shropshire 10/11/2014, 1:12 PM

## 2014-10-11 NOTE — Progress Notes (Signed)
Checked to see if pre-authorization would be needed for non-emergent EMS transport.  Per [employer]’s automated system, [phone], patient has a [employer] Medicare Advantage PPO policy.  [employer] Medicare PPO plans do not require pre-auth for non-emergent ground transports using service codes A0426 or A0428.   °

## 2014-10-11 NOTE — Clinical Social Work Note (Signed)
Clinical Social Work Assessment  Patient Details  Name: Roberto Griffin MRN: [id number] Date of Birth: September 18, 1924  Date of referral:  10/11/14               Reason for consult:  Facility Placement                Permission sought to share information with:  Chartered certified accountant granted to share information::  Yes, Verbal Permission Granted  Name::      [place]::   [place]   Relationship::     Contact Information:     Housing/Transportation Living arrangements for the past 2 months:  [place] of Information:  Adult Children Patient Interpreter Needed:  None Criminal Activity/Legal Involvement Pertinent to Current Situation/Hospitalization:  No - Comment as needed Significant Relationships:  Adult Children Lives with:  Self Do you feel safe going back to the place where you live?  Yes Need for family participation in patient care:  Yes (Comment)  Care giving concerns: Patient lives alone on [address] in [place].    Social Worker assessment / plan: Holiday representative (CSW) received verbal consult from RN that patient is a plus 2 assist. CSW attempted to meet with patient however he appeared confused. CSW left a voicemail for patient's daughter Roberto Griffin and son Roberto Griffin. CSW also contacted patient's son Roberto Griffin and completed assessment. Per son Roberto Griffin patient lives alone in [place] and does well. Per Roberto Griffin his sister Roberto Griffin gets patient up in the morning and him and his brother Roberto Griffin check on patient at night. Per Roberto Griffin him and his brother Roberto Griffin work during the day. Per son Roberto Griffin is taking care of HPOA and is on her way to the hospital. CSW explained to son that PT is recommending rehab. Son is agreeable to SNF search and prefers [facility]. Per son he is concerned that patient may need surgery and he may not strong enough. Dr. Bridgett Larsson has ordered an MRI.    FL2 complete and on chart. CSW will meet with  patient's daughter Roberto Griffin when she gets to hospital to present offers.    Employment status:  Disabled (Comment on whether or not currently receiving Disability), Retired Nurse, adult PT Recommendations:  [place] / Referral to community resources:  [place]  Patient/Family's Response to care: Son Roberto Griffin is agreeable to SNF search.   Patient/Family's Understanding of and Emotional Response to Diagnosis, Current Treatment, and Prognosis: Son was pleasant and thanked CSW for calling.   Emotional Assessment Appearance:  Appears stated age Attitude/Demeanor/Rapport:    Affect (typically observed):  Unable to Assess Orientation:  Fluctuating Orientation (Suspected and/or reported Sundowners) Alcohol / Substance use:  Not Applicable Psych involvement (Current and /or in the community):  No (Comment)  Discharge Needs  Concerns to be addressed:  Discharge Planning Concerns Readmission within the last 30 days:  No Current discharge risk:  None Barriers to Discharge:  Continued Medical Work up   Loralyn Freshwater, LCSW 10/11/2014, 10:51 AM

## 2014-10-11 NOTE — Progress Notes (Signed)
Patient is medically stable for D/C to [facility] today. Per [facility] admissions coordinator at [facility] patient is going to room 410. RN will call report. Clinical Education officer, museum (CSW) prepared D/C packet and sent D/C Summary to [employer] via carefinder. Patient's son's will attempt to provide transport. EMS form is complete in case it is needed. Patient's daughter Roberto Griffin is at bedside and aware of above. Doug met with daughter and completed admissions paper work today. Please reconsult if future social work needs arise. CSW signing off.   Blima Rich, [place] [phone]

## 2014-10-20 ENCOUNTER — Encounter: Payer: Self-pay | Admitting: Emergency Medicine

## 2014-10-20 ENCOUNTER — Emergency Department
Admission: EM | Admit: 2014-10-20 | Discharge: 2014-10-20 | Disposition: A | Discharge status: Home / Self Care | Payer: Medicare Other | Attending: Emergency Medicine | Admitting: Emergency Medicine

## 2014-10-20 ENCOUNTER — Emergency Department: Payer: Medicare Other

## 2014-10-20 DIAGNOSIS — E119 Type 2 diabetes mellitus without complications: Secondary | ICD-10-CM | POA: Insufficient documentation

## 2014-10-20 DIAGNOSIS — Z79899 Other long term (current) drug therapy: Secondary | ICD-10-CM | POA: Diagnosis not present

## 2014-10-20 DIAGNOSIS — N183 Chronic kidney disease, stage 3 (moderate): Secondary | ICD-10-CM | POA: Insufficient documentation

## 2014-10-20 DIAGNOSIS — Z87891 Personal history of nicotine dependence: Secondary | ICD-10-CM | POA: Insufficient documentation

## 2014-10-20 DIAGNOSIS — I129 Hypertensive chronic kidney disease with stage 1 through stage 4 chronic kidney disease, or unspecified chronic kidney disease: Secondary | ICD-10-CM | POA: Insufficient documentation

## 2014-10-20 DIAGNOSIS — R0989 Other specified symptoms and signs involving the circulatory and respiratory systems: Secondary | ICD-10-CM

## 2014-10-20 DIAGNOSIS — R0600 Dyspnea, unspecified: Secondary | ICD-10-CM | POA: Diagnosis present

## 2014-10-20 HISTORY — DX: Benign prostatic hyperplasia without lower urinary tract symptoms: N40.0

## 2014-10-20 MED ORDER — IPRATROPIUM-ALBUTEROL 0.5-2.5 (3) MG/3ML IN SOLN
3.0000 mL | Freq: Once | RESPIRATORY_TRACT | Status: AC
  Administered 2014-10-20: 3 mL via RESPIRATORY_TRACT
  Filled 2014-10-20: qty 3

## 2014-10-20 NOTE — ED Notes (Signed)
Ems pt from [facility] , possible aspiration , arrives on NRB 100% , awake , audible crackles heard on arrival, pt coughing and producing food particles and thick clear to cream colored sputum, pt was placed [place] o2 at 4 liters sats 95%, pt with improved respiratory rate 22 , difficulty speaking in clear complete sentences , pt arrived with top dentures , no teeth or dentures on the bottom

## 2014-10-20 NOTE — ED Provider Notes (Signed)
[facility] Emergency Department Provider Note  ____________________________________________  Time seen: Approximately 9:47 AM  I have reviewed the triage vital signs and the nursing notes.   HISTORY  Chief Complaint Respiratory Distress    HPI Roberto Griffin is a 81 y.o. male patient was at the nursing home eating breakfast. He was found gagging. Patient reports that he choked on some food. In the ER patient has gurgling respirations 100% sat on nonrebreather there by EMS. EMS reports he did not get sat before putting him on oxygen. Patient suction little bit gags and spits out 10 or 15 cc of chewed up food with cream colored stuff that possibly his milk. He coughs repeatedly and spits out some more material. After this his sats are running about 8990 on room air. He has slight amount of stridor. Lungs sound much better. Patient reports he was doing well before he choked. Past medical history as noted below.   Past Medical History  Diagnosis Date  . Diabetes mellitus without complication ([place])   . Hypertension   . CVA (cerebral infarction)   . GERD (gastroesophageal reflux disease)   . CKD (chronic kidney disease), stage III   . BPH (benign prostatic hyperplasia)     Patient Active Problem List   Diagnosis Date Noted  . Hip pain 10/10/2014  . Diabetic foot ulcer ([place]) 08/29/2014  . Type 2 diabetes, uncontrolled, with cellulitis of toe ([place]) 08/29/2014  . GERD (gastroesophageal reflux disease) 08/29/2014  . H/O: CVA (cerebrovascular accident) 08/29/2014  . CKD (chronic kidney disease), stage III 08/29/2014  . Generalized weakness 03/03/2012  . DM (diabetes mellitus) type 2, uncontrolled, with ketoacidosis ([place]) 03/03/2012  . HTN (hypertension) 03/03/2012    Past Surgical History  Procedure Laterality Date  . Cholecystectomy    . Appendectomy      Current Outpatient Rx  Name  Route  Sig  Dispense  Refill  . amLODipine (NORVASC) 10 MG tablet    Oral   Take 10 mg by mouth daily.         Marland Kitchen glyBURIDE (DIABETA) 2.5 MG tablet   Oral   Take 2.5 mg by mouth daily.          Marland Kitchen lisinopril (PRINIVIL) 10 MG tablet   Oral   Take 1 tablet (10 mg total) by mouth daily.   30 tablet   0   . metFORMIN (GLUCOPHAGE) 500 MG tablet   Oral   Take 250 mg by mouth daily.         . metoprolol tartrate (LOPRESSOR) 25 MG tablet   Oral   Take 1 tablet (25 mg total) by mouth 2 (two) times daily.   60 tablet   0   . Multiple Vitamin (MULTIVITAMIN WITH MINERALS) TABS   Oral   Take 1 tablet by mouth daily.         Marland Kitchen omeprazole (PRILOSEC) 20 MG capsule   Oral   Take 20 mg by mouth daily.         Marland Kitchen oxyCODONE (OXY IR/ROXICODONE) 5 MG immediate release tablet   Oral   Take 1 tablet (5 mg total) by mouth every 4 (four) hours as needed for moderate pain.   20 tablet   0   . polyethylene glycol (MIRALAX / GLYCOLAX) packet   Oral   Take 17 g by mouth daily as needed for mild constipation.   14 each   0     Allergies Review of patient's allergies indicates  no known allergies.  Family History  Problem Relation Age of Onset  . Diabetes Mother   . Cerebral aneurysm Father     Social History Social History  Substance Use Topics  . Smoking status: Former Research scientist (life sciences)  . Smokeless tobacco: None     Comment: quit 20 years ago  . Alcohol Use: No    Review of Systems Constitutional: No fever/chills Eyes: No visual changes. ENT: No sore throat. Cardiovascular: Denies chest pain. Respiratory: Patient is somewhat short of breath still. Gastrointestinal: No abdominal pain.  No nausea, no vomiting.  No diarrhea.  No constipation. Genitourinary: Negative for dysuria. Musculoskeletal: Negative for back pain. Skin: Negative for rash. Neurological: Negative for headaches, focal weakness or numbness.  10-point ROS otherwise negative.  ____________________________________________   PHYSICAL EXAM:  VITAL SIGNS: ED Triage Vitals   Enc Vitals Group     BP --      Pulse --      Resp --      Temp --      Temp src --      SpO2 --      Weight --      Height --      Head Cir --      Peak Flow --      Pain Score --      Pain Loc --      Pain Edu? --      Excl. in [place]? --    Constitutional: Alert and oriented. Patient working to breathe. Eyes: Conjunctivae are normal. PERRL. EOMI. Head: Atraumatic. Nose: No congestion/rhinnorhea. Mouth/Throat: Mucous membranes are moist.  Oropharynx non-erythematous. Neck stridor. Cardiovascular: Normal rate, regular rhythm. Grossly normal heart sounds.  Good peripheral circulation. Respiratory: Normal respiratory effort.  No retractions. Lungs CTAB. Gastrointestinal: Soft and nontender. No distention. No abdominal bruits. No CVA tenderness. Musculoskeletal: No lower extremity tenderness nor edema.  No joint effusions. Neurologic:  Normal speech and language. Skin:  Skin is warm, dry and intact. No rash noted.   ____________________________________________   LABS (all labs ordered are listed, but only abnormal results are displayed)  Labs Reviewed - No data to display ____________________________________________  EKG  EKG read and interpreted by me shows normal sinus rhythm with multiple multifocal PVCs. Patient has left axis deviation and right bundle branch block. ____________________________________________  RADIOLOGY  CT of the neck and chest shows no evidence of aspiration or radiology ____________________________________________   PROCEDURES    ____________________________________________   INITIAL IMPRESSION / ASSESSMENT AND PLAN / ED COURSE  Pertinent labs & imaging results that were available during my care of the patient were reviewed by me and considered in my medical decision making (see chart for details). PVCs have now resolved  ____________________________________________   FINAL CLINICAL IMPRESSION(S) / ED DIAGNOSES  Final diagnoses:   Choking episode      Nena Polio, MD 10/20/14 1254

## 2014-10-20 NOTE — ED Notes (Signed)
Patient transported to CT 

## 2014-10-20 NOTE — ED Notes (Signed)
Family at bedside. 

## 2014-10-20 NOTE — Discharge Instructions (Signed)
Please be careful while you're eating. If you choke again please return to the emergency room. She develop any fever or worsening shortness of breath again please also return to the emergency room.

## 2016-11-20 IMAGING — MR MR HIP*R* WO/W CM
5 of 8 series · 22 of 40 positions shown · IV contrast (multihance)
Comparison: None.

CLINICAL DATA: Right hip pain for 2 weeks. Pain with
weight-bearing.

EXAM:
MRI OF THE RIGHT HIP WITHOUT AND WITH CONTRAST
TECHNIQUE: Multiplanar, multisequence MR imaging was performed both before and
after administration of intravenous contrast.
CONTRAST:  20mL MULTIHANCE GADOBENATE DIMEGLUMINE 529 MG/ML IV SOLN

[Series 3: T1 · axial · 5.0mm · 0.74mm/px · z∈[-13,+212]mm · 6 of 37 slices shown]
[im 1/37]
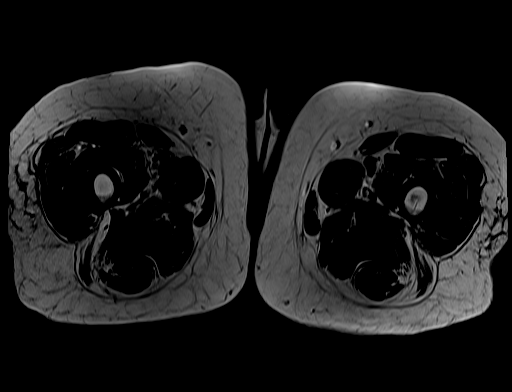
[im 8/37]
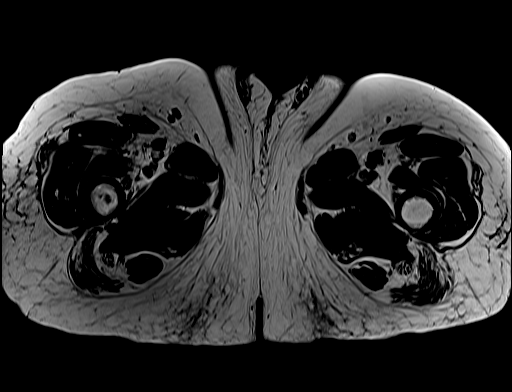
[im 15/37]
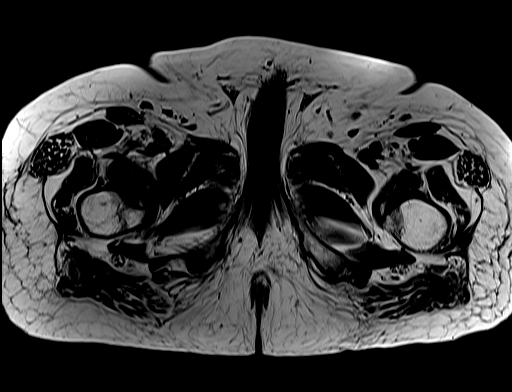
[im 22/37]
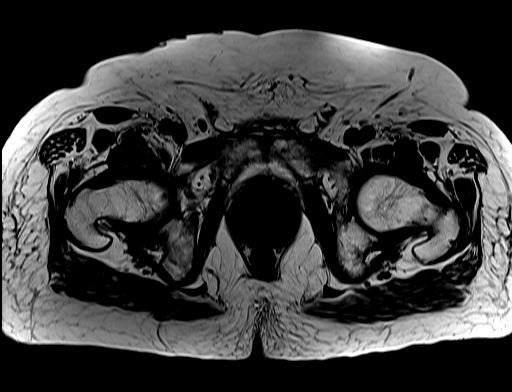
[im 29/37]
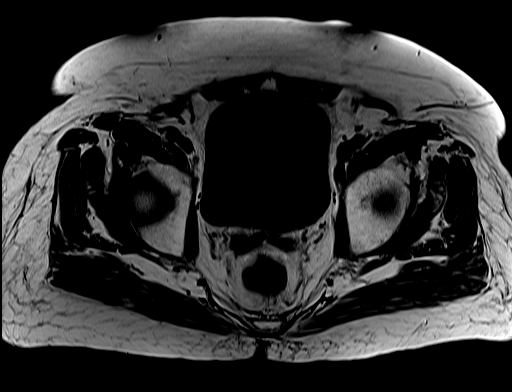
[im 37/37]
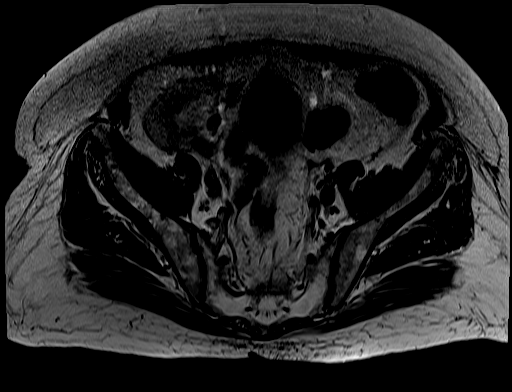

[Series 4: T2 fat-sat · axial · 5.0mm · 0.74mm/px · z∈[-13,+212]mm · 5 of 37 slices shown (1 of 2)]
[im 1/37]
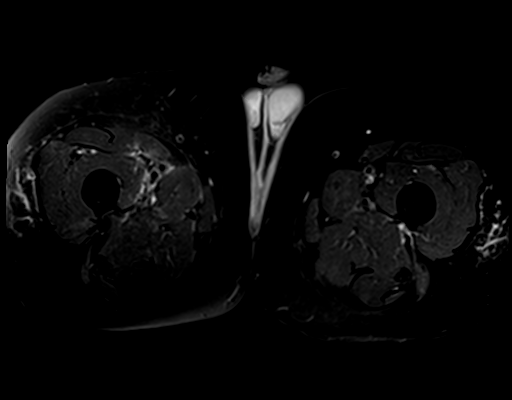
[im 10/37]
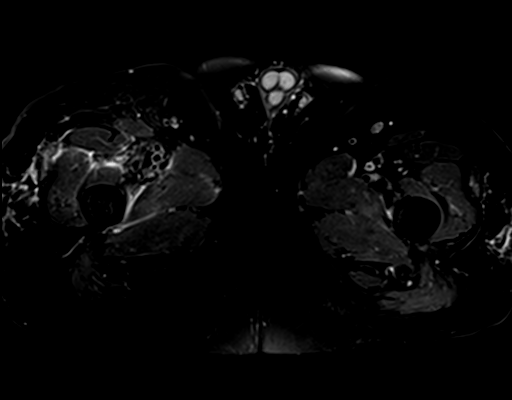
[im 19/37]
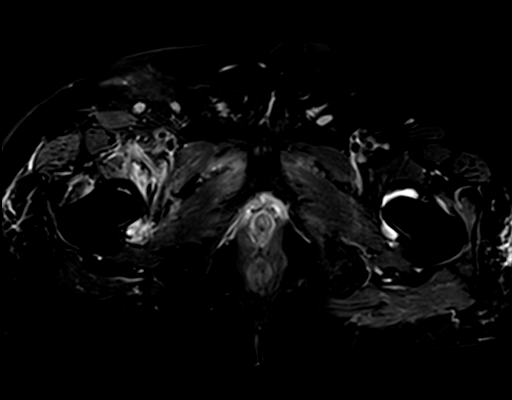
[im 28/37]
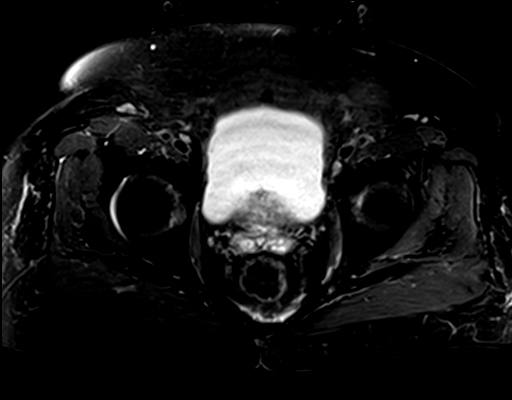
[im 37/37]
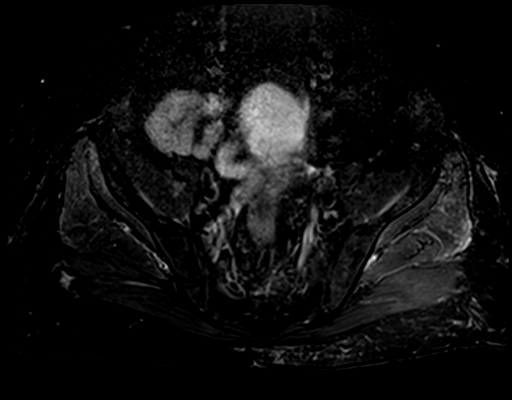

[Series 5: T1 fat-sat · axial · 5.0mm · 0.74mm/px · z∈[-13,+212]mm · 5 of 37 slices shown (1 of 2)]
[im 1/37]
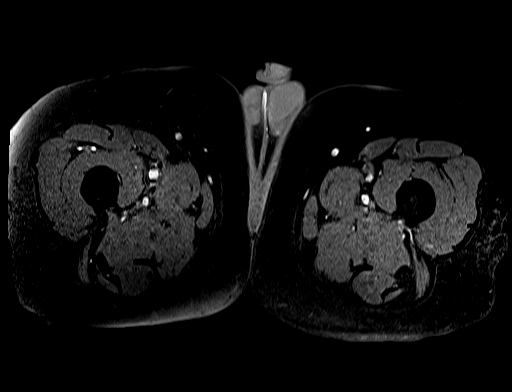
[im 10/37]
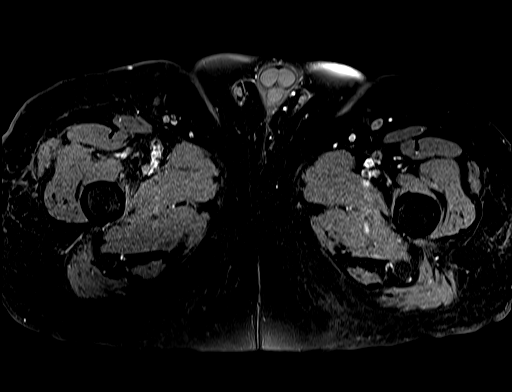
[im 19/37]
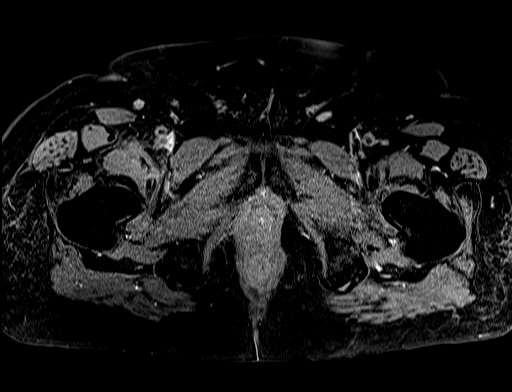
[im 28/37]
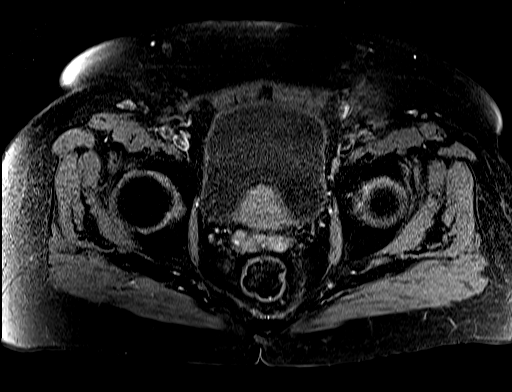
[im 37/37]
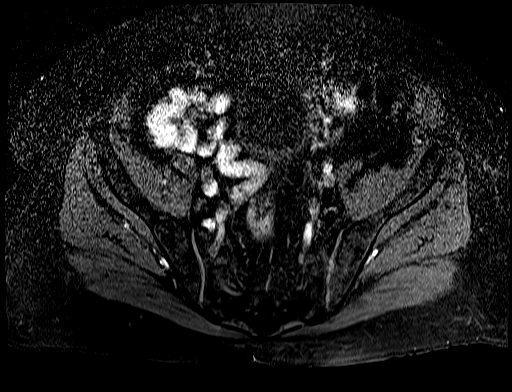

[Series 6: T1 fat-sat · coronal · 5.0mm · 0.74mm/px · 1 of 32 slices shown (2 of 2)]
[im 1/32]
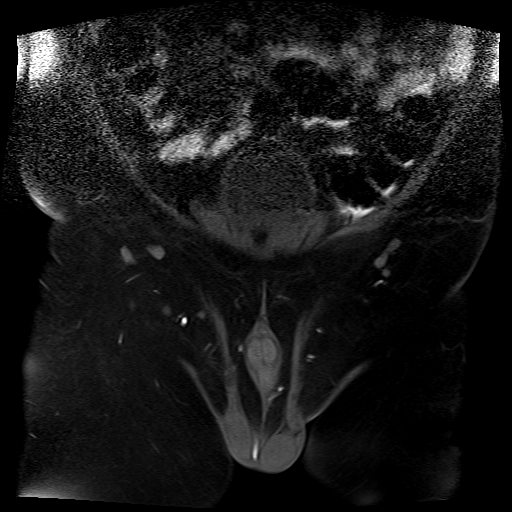

[Series 7: T2 fat-sat · coronal · 5.0mm · 0.74mm/px · 5 of 32 slices shown (2 of 2)]
[im 1/32]
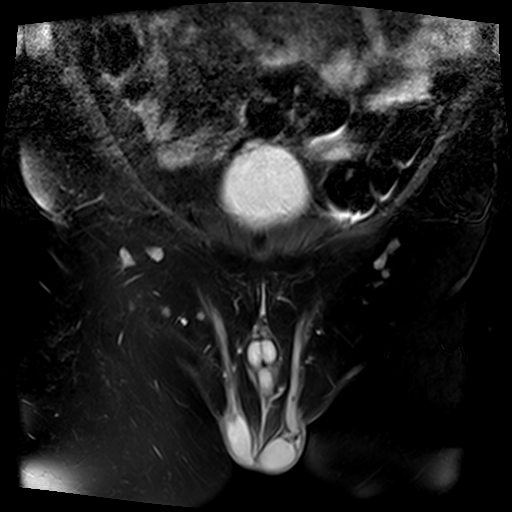
[im 8/32]
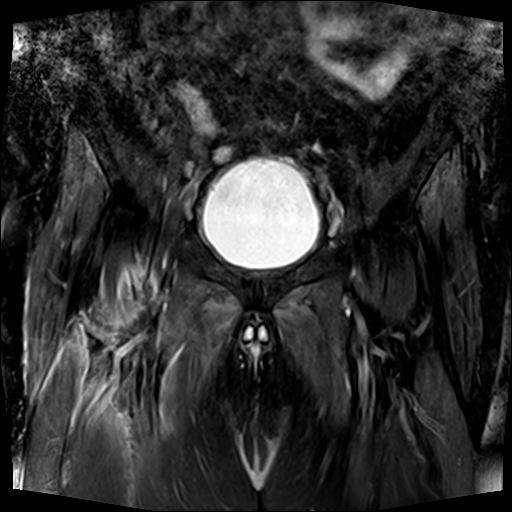
[im 16/32]
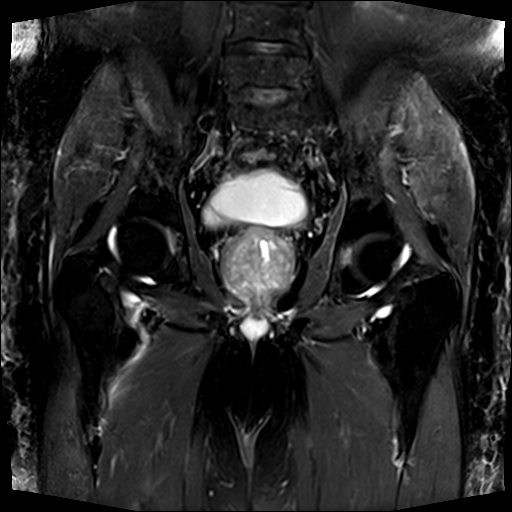
[im 24/32]
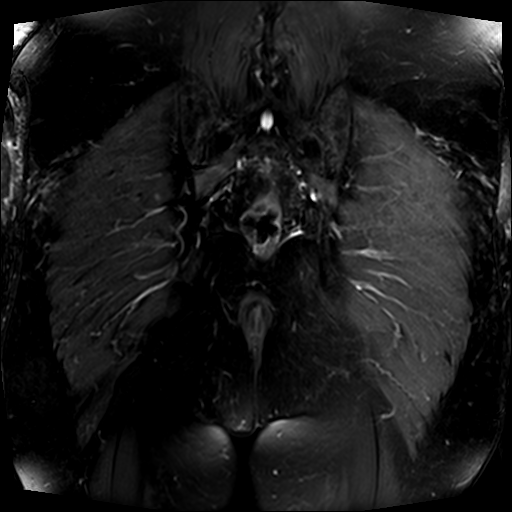
[im 32/32]
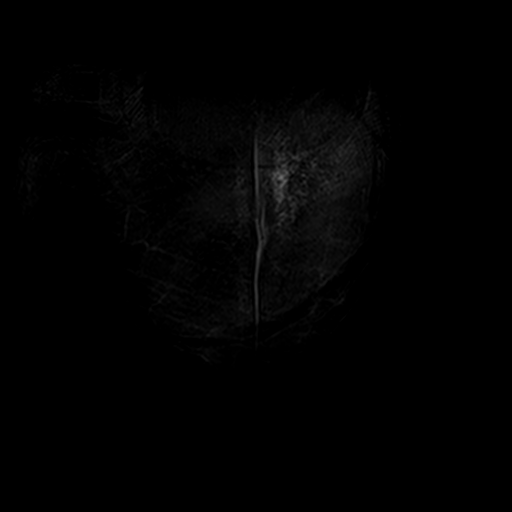

[22 of 40 positions shown; findings below may reference images not displayed]

FINDINGS: Bones: No focal marrow signal abnormality. No fracture, dislocation
or avascular necrosis. Normal sacrum and sacroiliac joints.

Articular cartilage and labrum

Articular cartilage: Mild chondromalacia, but evaluation is limited
secondary to large field of view.

Labrum:  The no gross labral or paralabral abnormality.

Joint or bursal effusion

Joint effusion:  No significant joint effusion.

Bursae:  No bursa formation.

Muscles and tendons

Flexors: High-grade partial-thickness tear of the right iliacus
musculotendinous junction. The left iliopsoas tendon is intact.

Extensors: Normal.

Abductors: Normal.

Adductors: Normal.

Rotators: Normal.

Hamstrings: Normal.

Gluteals: Mild edema within the peripheral aspect of the right
gluteus maximus at the trochanteric insertion likely reflecting mild
muscle strain. Remainder of the gluteal musculature is normal.

Other findings

Miscellaneous: Prostatic enlargement measuring 5.9 x 5.2 cm in axial
dimension. Prostate impresses upon the bladder base. Mild bladder
wall irregularity which may reflect a component of bladder outlet
obstruction. No inguinal lymphadenopathy. No inguinal hernia. No
pelvic free fluid.
IMPRESSION: 1. No hip fracture, dislocation or avascular necrosis.
2. High-grade partial-thickness tear of the right iliacus
musculotendinous junction.
3. Prostatic enlargement impressing upon the bladder base and with
mild bladder wall irregularity likely reflecting a component of
bladder outlet obstruction.

## 2017-01-13 ENCOUNTER — Inpatient Hospital Stay
Admission: EM | Admit: 2017-01-13 | Discharge: 2017-01-17 | DRG: 177 | Disposition: A | Discharge status: Hospice | Payer: 59 | Attending: Internal Medicine | Admitting: Internal Medicine

## 2017-01-13 ENCOUNTER — Other Ambulatory Visit: Payer: Self-pay

## 2017-01-13 ENCOUNTER — Emergency Department: Payer: 59

## 2017-01-13 ENCOUNTER — Encounter: Payer: Self-pay | Admitting: Emergency Medicine

## 2017-01-13 DIAGNOSIS — N400 Enlarged prostate without lower urinary tract symptoms: Secondary | ICD-10-CM | POA: Diagnosis present

## 2017-01-13 DIAGNOSIS — N183 Chronic kidney disease, stage 3 (moderate): Secondary | ICD-10-CM | POA: Diagnosis present

## 2017-01-13 DIAGNOSIS — R1313 Dysphagia, pharyngeal phase: Secondary | ICD-10-CM | POA: Diagnosis present

## 2017-01-13 DIAGNOSIS — R0602 Shortness of breath: Secondary | ICD-10-CM

## 2017-01-13 DIAGNOSIS — Z87891 Personal history of nicotine dependence: Secondary | ICD-10-CM | POA: Diagnosis not present

## 2017-01-13 DIAGNOSIS — J690 Pneumonitis due to inhalation of food and vomit: Secondary | ICD-10-CM | POA: Diagnosis not present

## 2017-01-13 DIAGNOSIS — J9621 Acute and chronic respiratory failure with hypoxia: Secondary | ICD-10-CM | POA: Diagnosis present

## 2017-01-13 DIAGNOSIS — I129 Hypertensive chronic kidney disease with stage 1 through stage 4 chronic kidney disease, or unspecified chronic kidney disease: Secondary | ICD-10-CM | POA: Diagnosis present

## 2017-01-13 DIAGNOSIS — J189 Pneumonia, unspecified organism: Secondary | ICD-10-CM | POA: Diagnosis present

## 2017-01-13 DIAGNOSIS — R778 Other specified abnormalities of plasma proteins: Secondary | ICD-10-CM

## 2017-01-13 DIAGNOSIS — Z7984 Long term (current) use of oral hypoglycemic drugs: Secondary | ICD-10-CM

## 2017-01-13 DIAGNOSIS — N179 Acute kidney failure, unspecified: Secondary | ICD-10-CM | POA: Diagnosis present

## 2017-01-13 DIAGNOSIS — E1122 Type 2 diabetes mellitus with diabetic chronic kidney disease: Secondary | ICD-10-CM | POA: Diagnosis present

## 2017-01-13 DIAGNOSIS — K219 Gastro-esophageal reflux disease without esophagitis: Secondary | ICD-10-CM | POA: Diagnosis present

## 2017-01-13 DIAGNOSIS — Z66 Do not resuscitate: Secondary | ICD-10-CM | POA: Diagnosis present

## 2017-01-13 DIAGNOSIS — Z9981 Dependence on supplemental oxygen: Secondary | ICD-10-CM | POA: Diagnosis not present

## 2017-01-13 DIAGNOSIS — R7989 Other specified abnormal findings of blood chemistry: Secondary | ICD-10-CM

## 2017-01-13 DIAGNOSIS — Z833 Family history of diabetes mellitus: Secondary | ICD-10-CM

## 2017-01-13 DIAGNOSIS — I248 Other forms of acute ischemic heart disease: Secondary | ICD-10-CM | POA: Diagnosis present

## 2017-01-13 DIAGNOSIS — Z8701 Personal history of pneumonia (recurrent): Secondary | ICD-10-CM | POA: Diagnosis not present

## 2017-01-13 DIAGNOSIS — J441 Chronic obstructive pulmonary disease with (acute) exacerbation: Secondary | ICD-10-CM | POA: Diagnosis present

## 2017-01-13 DIAGNOSIS — J181 Lobar pneumonia, unspecified organism: Secondary | ICD-10-CM

## 2017-01-13 DIAGNOSIS — Z8673 Personal history of transient ischemic attack (TIA), and cerebral infarction without residual deficits: Secondary | ICD-10-CM

## 2017-01-13 LAB — CBC WITH DIFFERENTIAL/PLATELET
BASOS PCT: 1 %
Basophils Absolute: 0 10*3/uL (ref 0–0.1)
Eosinophils Absolute: 0 10*3/uL (ref 0–0.7)
Eosinophils Relative: 1 %
HEMATOCRIT: 35.4 % — AB (ref 40.0–52.0)
Hemoglobin: 11.9 g/dL — ABNORMAL LOW (ref 13.0–18.0)
Lymphocytes Relative: 26 %
Lymphs Abs: 1.9 10*3/uL (ref 1.0–3.6)
MCH: 28.9 pg (ref 26.0–34.0)
MCHC: 33.6 g/dL (ref 32.0–36.0)
MCV: 85.8 fL (ref 80.0–100.0)
Monocytes Absolute: 0.8 10*3/uL (ref 0.2–1.0)
Monocytes Relative: 11 %
NEUTROS ABS: 4.5 10*3/uL (ref 1.4–6.5)
Neutrophils Relative %: 61 %
Platelets: 218 10*3/uL (ref 150–440)
RBC: 4.12 MIL/uL — ABNORMAL LOW (ref 4.40–5.90)
RDW: 15.6 % — ABNORMAL HIGH (ref 11.5–14.5)
WBC: 7.2 10*3/uL (ref 3.8–10.6)

## 2017-01-13 LAB — TROPONIN I: Troponin I: 0.11 ng/mL (ref <0.03)

## 2017-01-13 LAB — BASIC METABOLIC PANEL
Anion gap: 8 (ref 5–15)
BUN: 34 mg/dL — ABNORMAL HIGH (ref 6–20)
CALCIUM: 8.6 mg/dL — AB (ref 8.9–10.3)
CO2: 25 mmol/L (ref 22–32)
CREATININE: 1.38 mg/dL — AB (ref 0.61–1.24)
Chloride: 104 mmol/L (ref 101–111)
GFR calc non Af Amer: 43 mL/min — ABNORMAL LOW (ref >60)
GFR, EST AFRICAN AMERICAN: 50 mL/min — AB (ref >60)
GLUCOSE: 192 mg/dL — AB (ref 65–99)
Potassium: 4.1 mmol/L (ref 3.5–5.1)
Sodium: 137 mmol/L (ref 135–145)

## 2017-01-13 LAB — BLOOD GAS, VENOUS
Acid-Base Excess: 0.6 mmol/L (ref 0.0–2.0)
BICARBONATE: 27 mmol/L (ref 20.0–28.0)
O2 SAT: 57 %
PATIENT TEMPERATURE: 37
PO2 VEN: 32 mmHg (ref 32.0–45.0)
pCO2, Ven: 50 mmHg (ref 44.0–60.0)
pH, Ven: 7.34 (ref 7.250–7.430)

## 2017-01-13 LAB — LACTIC ACID, PLASMA: Lactic Acid, Venous: 2.7 mmol/L (ref 0.5–1.9)

## 2017-01-13 LAB — GLUCOSE, CAPILLARY: GLUCOSE-CAPILLARY: 221 mg/dL — AB (ref 65–99)

## 2017-01-13 LAB — BRAIN NATRIURETIC PEPTIDE: B Natriuretic Peptide: 119 pg/mL — ABNORMAL HIGH (ref 0.0–100.0)

## 2017-01-13 MED ORDER — LISINOPRIL 10 MG PO TABS
10.0000 mg | ORAL_TABLET | Freq: Every day | ORAL | Status: DC
  Administered 2017-01-13 – 2017-01-17 (×5): 10 mg via ORAL
  Filled 2017-01-13 (×5): qty 1

## 2017-01-13 MED ORDER — ENOXAPARIN SODIUM 40 MG/0.4ML ~~LOC~~ SOLN
40.0000 mg | SUBCUTANEOUS | Status: DC
  Administered 2017-01-13 – 2017-01-16 (×4): 40 mg via SUBCUTANEOUS
  Filled 2017-01-13 (×4): qty 0.4

## 2017-01-13 MED ORDER — INSULIN ASPART 100 UNIT/ML ~~LOC~~ SOLN
0.0000 [IU] | Freq: Every day | SUBCUTANEOUS | Status: DC
  Administered 2017-01-13 – 2017-01-14 (×2): 2 [IU] via SUBCUTANEOUS
  Filled 2017-01-13 (×2): qty 1

## 2017-01-13 MED ORDER — METFORMIN HCL 500 MG PO TABS
250.0000 mg | ORAL_TABLET | Freq: Every day | ORAL | Status: DC
  Administered 2017-01-14: 09:00:00 250 mg via ORAL
  Filled 2017-01-13: qty 1

## 2017-01-13 MED ORDER — INSULIN ASPART 100 UNIT/ML ~~LOC~~ SOLN
0.0000 [IU] | Freq: Three times a day (TID) | SUBCUTANEOUS | Status: DC
  Administered 2017-01-14: 09:00:00 2 [IU] via SUBCUTANEOUS
  Administered 2017-01-14 (×2): 3 [IU] via SUBCUTANEOUS
  Administered 2017-01-15 – 2017-01-17 (×8): 2 [IU] via SUBCUTANEOUS
  Filled 2017-01-13 (×11): qty 1

## 2017-01-13 MED ORDER — FUROSEMIDE 10 MG/ML IJ SOLN
40.0000 mg | Freq: Once | INTRAMUSCULAR | Status: AC
  Administered 2017-01-13: 40 mg via INTRAVENOUS

## 2017-01-13 MED ORDER — SODIUM CHLORIDE 0.9 % IV SOLN
Freq: Once | INTRAVENOUS | Status: AC
  Administered 2017-01-13: 16:00:00 via INTRAVENOUS

## 2017-01-13 MED ORDER — ONDANSETRON HCL 4 MG/2ML IJ SOLN: 4.0000 mg | Freq: Four times a day (QID) | INTRAMUSCULAR | Status: DC | PRN

## 2017-01-13 MED ORDER — IPRATROPIUM-ALBUTEROL 0.5-2.5 (3) MG/3ML IN SOLN
3.0000 mL | Freq: Once | RESPIRATORY_TRACT | Status: AC
  Administered 2017-01-13: 3 mL via RESPIRATORY_TRACT
  Filled 2017-01-13: qty 3

## 2017-01-13 MED ORDER — DEXTROSE 5 % IV SOLN
2.0000 g | INTRAVENOUS | Status: DC
  Administered 2017-01-13 – 2017-01-15 (×3): 2 g via INTRAVENOUS
  Filled 2017-01-13 (×4): qty 2

## 2017-01-13 MED ORDER — AMLODIPINE BESYLATE 10 MG PO TABS
10.0000 mg | ORAL_TABLET | Freq: Every day | ORAL | Status: DC
  Administered 2017-01-13 – 2017-01-17 (×5): 10 mg via ORAL
  Filled 2017-01-13 (×2): qty 1
  Filled 2017-01-13 (×2): qty 2
  Filled 2017-01-13: qty 1

## 2017-01-13 MED ORDER — DEXTROSE 5 % IV SOLN
500.0000 mg | INTRAVENOUS | Status: DC
  Administered 2017-01-13 – 2017-01-15 (×3): 500 mg via INTRAVENOUS
  Filled 2017-01-13 (×4): qty 500

## 2017-01-13 MED ORDER — POLYETHYLENE GLYCOL 3350 17 G PO PACK: 17.0000 g | PACK | Freq: Every day | ORAL | Status: DC | PRN

## 2017-01-13 MED ORDER — FINASTERIDE 5 MG PO TABS
5.0000 mg | ORAL_TABLET | Freq: Every day | ORAL | Status: DC
  Administered 2017-01-14 – 2017-01-17 (×4): 5 mg via ORAL
  Filled 2017-01-13 (×4): qty 1

## 2017-01-13 MED ORDER — OXYCODONE HCL 5 MG PO TABS: 5.0000 mg | ORAL_TABLET | ORAL | Status: DC | PRN

## 2017-01-13 MED ORDER — FUROSEMIDE 10 MG/ML IJ SOLN
INTRAMUSCULAR | Status: AC
  Administered 2017-01-13: 40 mg via INTRAVENOUS
  Filled 2017-01-13: qty 4

## 2017-01-13 MED ORDER — GUAIFENESIN ER 600 MG PO TB12
600.0000 mg | ORAL_TABLET | Freq: Two times a day (BID) | ORAL | Status: DC
  Administered 2017-01-13 – 2017-01-17 (×7): 600 mg via ORAL
  Filled 2017-01-13 (×8): qty 1

## 2017-01-13 MED ORDER — IPRATROPIUM-ALBUTEROL 0.5-2.5 (3) MG/3ML IN SOLN
3.0000 mL | RESPIRATORY_TRACT | Status: DC
  Administered 2017-01-13 – 2017-01-15 (×10): 3 mL via RESPIRATORY_TRACT
  Filled 2017-01-13 (×10): qty 3

## 2017-01-13 MED ORDER — ONDANSETRON HCL 4 MG PO TABS: 4.0000 mg | ORAL_TABLET | Freq: Four times a day (QID) | ORAL | Status: DC | PRN

## 2017-01-13 MED ORDER — ACETAMINOPHEN 650 MG RE SUPP: 650.0000 mg | Freq: Four times a day (QID) | RECTAL | Status: DC | PRN

## 2017-01-13 MED ORDER — METOPROLOL TARTRATE 25 MG PO TABS
25.0000 mg | ORAL_TABLET | Freq: Two times a day (BID) | ORAL | Status: DC
  Administered 2017-01-13 – 2017-01-17 (×7): 25 mg via ORAL
  Filled 2017-01-13 (×9): qty 1

## 2017-01-13 MED ORDER — VANCOMYCIN HCL IN DEXTROSE 1-5 GM/200ML-% IV SOLN
1000.0000 mg | Freq: Once | INTRAVENOUS | Status: AC
  Administered 2017-01-13: 1000 mg via INTRAVENOUS
  Filled 2017-01-13: qty 200

## 2017-01-13 MED ORDER — METHYLPREDNISOLONE SODIUM SUCC 125 MG IJ SOLR
60.0000 mg | Freq: Three times a day (TID) | INTRAMUSCULAR | Status: DC
  Administered 2017-01-13 – 2017-01-17 (×11): 60 mg via INTRAVENOUS
  Filled 2017-01-13 (×11): qty 2

## 2017-01-13 MED ORDER — ACETAMINOPHEN 325 MG PO TABS: 650.0000 mg | ORAL_TABLET | Freq: Four times a day (QID) | ORAL | Status: DC | PRN

## 2017-01-13 MED ORDER — ADULT MULTIVITAMIN W/MINERALS CH
1.0000 | ORAL_TABLET | Freq: Every day | ORAL | Status: DC
  Administered 2017-01-14 – 2017-01-17 (×4): 1 via ORAL
  Filled 2017-01-13 (×4): qty 1

## 2017-01-13 MED ORDER — PIPERACILLIN-TAZOBACTAM 3.375 G IVPB 30 MIN
3.3750 g | Freq: Once | INTRAVENOUS | Status: AC
  Administered 2017-01-13: 3.375 g via INTRAVENOUS
  Filled 2017-01-13: qty 50

## 2017-01-13 MED ORDER — TAMSULOSIN HCL 0.4 MG PO CAPS
0.4000 mg | ORAL_CAPSULE | Freq: Every day | ORAL | Status: DC
  Administered 2017-01-14 – 2017-01-17 (×4): 0.4 mg via ORAL
  Filled 2017-01-13 (×4): qty 1

## 2017-01-13 MED ORDER — PANTOPRAZOLE SODIUM 40 MG PO TBEC
40.0000 mg | DELAYED_RELEASE_TABLET | Freq: Every day | ORAL | Status: DC
  Administered 2017-01-14 – 2017-01-17 (×4): 40 mg via ORAL
  Filled 2017-01-13 (×4): qty 1

## 2017-01-13 NOTE — H&P (Signed)
[facility] [place] NAME: Roberto Griffin    MR#:  [id number]  DATE OF BIRTH:  16-Jan-1924  DATE OF ADMISSION:  01/13/2017  PRIMARY CARE PHYSICIAN: Morton Peters., MD   REQUESTING/REFERRING PHYSICIAN: Dr. Carrie Mew  CHIEF COMPLAINT:   Chief Complaint  Patient presents with  . Shortness of Breath    HISTORY OF PRESENT ILLNESS:  Roberto Griffin  is a 84 y.o. male with a known history of stroke, and diabetes mellitus, hypertension, GERD, CK D stage III brought in from home secondary to worsening shortness of breath for almost a week now. Most of the history is obtained from patient started at bedside. Patient has chronic respiratory failure secondary to COPD and is on home oxygen. He is also being followed by hospice at home. At baseline he was able to ambulate from bed to the commode and few steps around the house using a walker. Over the last week, his breathing has gotten much worse that he was unable to even take a few steps without getting short of breath. He was having audible wheezing and gurgling so the family brought him to the hospital. Chest x-ray revealed right upper lobe infiltrate. Lactic acid was elevated and patient was in visible respiratory distress[facility] for his [facility] to  This hospital. They deny any fevers or chills. He has been having dry cough and pedal edema. No vomiting or nausea but has decreased appetite.  PAST MEDICAL HISTORY:   Past Medical History:  Diagnosis Date  . BPH (benign prostatic hyperplasia)   . CKD (chronic kidney disease), stage III ([place])   . CVA (cerebral infarction)   . Diabetes mellitus without complication ([place])   . GERD (gastroesophageal reflux disease)   . Hypertension     PAST SURGICAL HISTORY:   Past Surgical History:  Procedure Laterality Date  . APPENDECTOMY    . CHOLECYSTECTOMY      SOCIAL HISTORY:   Social History    Tobacco Use  . Smoking status: Former Research scientist (life sciences)  . Tobacco comment: quit 20 years ago  Substance Use Topics  . Alcohol use: No    Alcohol/week: 0.0 oz    FAMILY HISTORY:   Family History  Problem Relation Age of Onset  . Diabetes Mother   . Cerebral aneurysm Father     DRUG ALLERGIES:  No Known Allergies  REVIEW OF SYSTEMS:   Review of Systems  Constitutional: Positive for chills and malaise/fatigue. Negative for fever and weight loss.  HENT: Negative for ear discharge, ear pain, hearing loss and nosebleeds.   Eyes: Negative for blurred vision, double vision and photophobia.  Respiratory: Positive for cough, shortness of breath and wheezing. Negative for hemoptysis.   Cardiovascular: Negative for chest pain, palpitations, orthopnea and leg swelling.  Gastrointestinal: Negative for abdominal pain, constipation, diarrhea, melena, nausea and vomiting.  Genitourinary: Negative for dysuria, hematuria and urgency.  Musculoskeletal: Negative for back pain, myalgias and neck pain.  Skin: Negative for rash.  Neurological: Negative for dizziness, tingling, sensory change, speech change and focal weakness.  Endo/Heme/Allergies: Does not bruise/bleed easily.  Psychiatric/Behavioral: Negative for depression.    MEDICATIONS AT HOME:   Prior to Admission medications   Medication Sig Start Date End Date Taking? Authorizing Provider  amLODipine (NORVASC) 10 MG tablet Take 10 mg by mouth daily.    [provider]  cephALEXin (KEFLEX) 500 MG capsule Take 1 capsule by mouth  2 (two) times daily. 01/10/17   [provider]  finasteride (PROSCAR) 5 MG tablet Take 1 tablet by mouth daily. 01/01/17   [provider]  glyBURIDE (DIABETA) 2.5 MG tablet Take 2.5 mg by mouth daily.     [provider]  hydrochlorothiazide (HYDRODIURIL) 25 MG tablet Take 1 tablet by mouth daily. 01/01/17   [provider]  lisinopril (PRINIVIL) 10 MG tablet Take 1 tablet  (10 mg total) by mouth daily. 03/04/12   Cletus Gash, MD  metFORMIN (GLUCOPHAGE) 500 MG tablet Take 250 mg by mouth daily.    [provider]  metoprolol tartrate (LOPRESSOR) 25 MG tablet Take 1 tablet (25 mg total) by mouth 2 (two) times daily. 03/04/12   Cletus Gash, MD  MUCINEX 600 MG 12 hr tablet Take 1 tablet by mouth 2 (two) times daily. 01/10/17   [provider]  Multiple Vitamin (MULTIVITAMIN WITH MINERALS) TABS Take 1 tablet by mouth daily.    [provider]  omeprazole (PRILOSEC) 20 MG capsule Take 20 mg by mouth daily.    [provider]  oxyCODONE (OXY IR/ROXICODONE) 5 MG immediate release tablet Take 1 tablet (5 mg total) by mouth every 4 (four) hours as needed for moderate pain. 10/11/14   Demetrios Loll, MD  polyethylene glycol [facility] / Floria Raveling) packet Take 17 g by mouth daily as needed for mild constipation. 10/11/14   Demetrios Loll, MD  tamsulosin (FLOMAX) 0.4 MG CAPS capsule Take 1 capsule by mouth daily. 01/01/17   [provider]      VITAL SIGNS:  Blood pressure (!) 154/79, pulse 90, temperature 99.5 F (37.5 C), temperature source Oral, resp. rate 20, height 6' (1.829 m), weight 102.1 kg (225 lb), SpO2 99 %.  PHYSICAL EXAMINATION:   Physical Exam  GENERAL:  84 y.o.-year-old elderly patient lying in the bed, appears to be in acute distress  EYES: Pupils equal, round, reactive to light and accommodation. No scleral icterus. Extraocular muscles intact.  HEENT: Head atraumatic, normocephalic. Oropharynx and nasopharynx clear.  NECK:  Supple, no jugular venous distention. No thyroid enlargement, no tenderness.  LUNGS: coarse breath sounds bilaterally, scattered expiratory wheezing, breath sounds bilaterally, no wheezing, rales,rhonchi or crepitation. using accessory muscles of respiration.  CARDIOVASCULAR: S1, S2 normal. No murmurs, rubs, or gallops.  ABDOMEN: Soft, nontender, nondistended. Bowel sounds present. No  organomegaly or mass.  EXTREMITIES: No cyanosis, or clubbing. 1+ pedal edema noted NEUROLOGIC: Cranial nerves II through XII are intact. Muscle strength 4/5 in all extremities. Sensation intact. Gait not checked. Global weakness PSYCHIATRIC: The patient is alert and oriented to self  SKIN: No obvious rash, lesion, or ulcer.   LABORATORY PANEL:   CBC Recent Labs  Lab 01/13/17 1335  WBC 7.2  HGB 11.9*  HCT 35.4*  PLT 218   ------------------------------------------------------------------------------------------------------------------  Chemistries  Recent Labs  Lab 01/13/17 1335  NA 137  K 4.1  CL 104  CO2 25  GLUCOSE 192*  BUN 34*  CREATININE 1.38*  CALCIUM 8.6*   ------------------------------------------------------------------------------------------------------------------  Cardiac Enzymes Recent Labs  Lab 01/13/17 1335  TROPONINI 0.11*   ------------------------------------------------------------------------------------------------------------------  RADIOLOGY:  Dg Chest Port 1 View  Result Date: 01/13/2017 CLINICAL DATA:  Shortness of breath. EXAM: PORTABLE CHEST 1 VIEW COMPARISON:  CT 10/20/2014.  Chest x-ray 08/29/2014 . FINDINGS: Stable mediastinal fullness consistent with prominent great vessels. Stable cardiomegaly. Mild right upper lobe infiltrate. No pleural effusion or pneumothorax. Degenerative changes thoracic spine . IMPRESSION: Mild right upper lobe infiltrate suggesting  pneumonia. Close follow-up chest x-rays to demonstrate clearing suggested. Electronically Signed   By: Marcello Moores  Register   On: 01/13/2017 14:23    EKG:   Orders placed or performed during the hospital encounter of 01/13/17  . EKG 12-Lead  . EKG 12-Lead  . ED EKG  . ED EKG    IMPRESSION AND PLAN:   Roberto Griffin  is a 84 y.o. male with a known history of stroke, and diabetes mellitus, hypertension, GERD, CK D stage III brought in from home secondary to worsening shortness of  breath for almost a week now.  1. Community acquired pneumonia-chest x-ray with right upper lobe infiltrate. -Follow blood cultures. -Started on Rocephin and azithromycin.  2. Elevated troponin-likely demand ischemia secondary Oxygen respiratory distress. Monitor troponins.  3. COPD exacerbation-started on steroids and DuoNeb's. Also inhalers and monitor. -Currently requiring 2 L oxygen by nasal cannula which is his chronic requirement.  4. Acute renal insufficiency on CK D- though BUN and creatinine are elevated, appears slightly overloaded - one dose of IV lasix and monitor  5. Diabetes mellitus-patient on metformin and also sliding scale insulin since he is on steroids  6. Hypertension-continue metoprolol, Norvasc and lisinopril  7. DVT prophylaxis-Lovenox  Physical therapy consulted    All the records are reviewed and case discussed with ED provider. Management plans discussed with the patient, family and they are in agreement.  CODE STATUS: DNR  TOTAL TIME TAKING CARE OF THIS PATIENT: 55 minutes.    Gladstone Lighter M.D on 01/13/2017 at 5:15 PM  Between 7am to 6pm - Pager - [phone]  After 6pm go to [email or web address] - password [email or web address] [facility]  Office  [phone]  CC: Primary care physician; Morton Peters., MD

## 2017-01-13 NOTE — ED Notes (Signed)
Patients room will be changing per charge RN on floor. Waiting to hear back from [facility]. Family notified.

## 2017-01-13 NOTE — Progress Notes (Signed)
Pharmacy Antibiotic Note  Roberto Griffin is a 84 y.o. male admitted on 01/13/2017 with pneumonia.  Pharmacy has been consulted for ceftriaxone dosing.  Plan: ceftriaxone 2gm iv q24h   Height: 6' (182.9 cm) Weight: 225 lb (102.1 kg) IBW/kg (Calculated) : 77.6  Temp (24hrs), Avg:99.5 F (37.5 C), Min:99.5 F (37.5 C), Max:99.5 F (37.5 C)  Recent Labs  Lab 01/13/17 1335  WBC 7.2  CREATININE 1.38*  LATICACIDVEN 2.7*    Estimated Creatinine Clearance: 44 mL/min (A) (by C-G formula based on SCr of 1.38 mg/dL (H)).    No Known Allergies  Antimicrobials this admission: Anti-infectives (From admission, onward)   Start     Dose/Rate Route Frequency Ordered Stop   01/13/17 1915  cefTRIAXone (ROCEPHIN) 2 g in dextrose 5 % 50 mL IVPB     2 g 100 mL/hr over 30 Minutes Intravenous Every 24 hours 01/13/17 1902     01/13/17 1900  azithromycin (ZITHROMAX) 500 mg in dextrose 5 % 250 mL IVPB     500 mg 250 mL/hr over 60 Minutes Intravenous Every 24 hours 01/13/17 1853     01/13/17 1445  vancomycin (VANCOCIN) IVPB 1000 mg/200 mL premix     1,000 mg 200 mL/hr over 60 Minutes Intravenous  Once 01/13/17 1435 01/13/17 1550   01/13/17 1445  piperacillin-tazobactam (ZOSYN) IVPB 3.375 g     3.375 g 100 mL/hr over 30 Minutes Intravenous  Once 01/13/17 1435 01/13/17 1520      Microbiology results: No results found for this or any previous visit (from the past 240 hour(s)).   Thank you for allowing pharmacy to be a part of this patient's care.  Donna Christen Halima Fogal 01/13/2017 7:02 PM

## 2017-01-13 NOTE — ED Provider Notes (Signed)
Received a call from the [place] who informs [place] that the [place] is currently on full hospital diversion and unable to accept any transfers. They advised that the patient should be admitted here at [facility] for ongoing care due to [facility] unable to provide care at this time. Case d/w hospitalist.    Carrie Mew, MD 01/13/17 [phone]

## 2017-01-13 NOTE — ED Notes (Signed)
Attempting to call report to 1C at this time.

## 2017-01-13 NOTE — ED Notes (Signed)
Patient has a DNR. Patient and his family spoke with Dr. Jimmye Norman, patient does not want intubation or CPR.

## 2017-01-13 NOTE — ED Notes (Signed)
Attempting to call report at this time. 

## 2017-01-13 NOTE — ED Notes (Signed)
Unable to call report at this time

## 2017-01-13 NOTE — ED Notes (Signed)
Attempted to call report x1. Unsuccessful.

## 2017-01-13 NOTE — ED Provider Notes (Signed)
[facility] Emergency Department Provider Note       Time seen: ----------------------------------------- 1:22 PM on 01/13/2017 -----------------------------------------   I have reviewed the triage vital signs and the nursing notes.  HISTORY   Chief Complaint Shortness of Breath    HPI Roberto Griffin is a 84 y.o. male with a history of chronic kidney disease, CVA, diabetes and hypertension who presents to the ED for shortness of breath coming by EMS from home.  He has a history of pneumonia and COPD.  He presents with shortness of breath on arrival with expiratory wheezing.  She currently is on hospice care and has been taking an antibiotic for what the family thinks was bronchitis.  He has had productive green sputum.  His sickness has now lasted for weeks.  Past Medical History:  Diagnosis Date  . BPH (benign prostatic hyperplasia)   . CKD (chronic kidney disease), stage III ([place])   . CVA (cerebral infarction)   . Diabetes mellitus without complication ([place])   . GERD (gastroesophageal reflux disease)   . Hypertension     Patient Active Problem List   Diagnosis Date Noted  . Hip pain 10/10/2014  . Diabetic foot ulcer ([place]) 08/29/2014  . Type 2 diabetes, uncontrolled, with cellulitis of toe ([place]) 08/29/2014  . GERD (gastroesophageal reflux disease) 08/29/2014  . H/O: CVA (cerebrovascular accident) 08/29/2014  . CKD (chronic kidney disease), stage III ([place]) 08/29/2014  . Generalized weakness 03/03/2012  . DM (diabetes mellitus) type 2, uncontrolled, with ketoacidosis ([place]) 03/03/2012  . HTN (hypertension) 03/03/2012    Past Surgical History:  Procedure Laterality Date  . APPENDECTOMY    . CHOLECYSTECTOMY      Allergies Patient has no known allergies.  Social History Social History   Tobacco Use  . Smoking status: Former Research scientist (life sciences)  . Tobacco comment: quit 20 years ago  Substance Use Topics  . Alcohol use: No    Alcohol/week: 0.0 oz  .  Drug use: No    Review of Systems Constitutional: Negative for fever. Cardiovascular: Negative for chest pain. Respiratory: Positive for shortness of breath and cough Gastrointestinal: Negative for abdominal pain, vomiting and diarrhea. Musculoskeletal: Negative for back pain. Skin: Negative for rash. Neurological: Negative for headaches, positive for progressive weakening  All systems negative/normal/unremarkable except as stated in the HPI  ____________________________________________   PHYSICAL EXAM:  VITAL SIGNS: ED Triage Vitals [01/13/17 1318]  Enc Vitals Group     BP (!) 146/68     Pulse Rate 94     Resp (!) 25     Temp 99.5 F (37.5 C)     Temp Source Oral     SpO2 94 %     Weight 225 lb (102.1 kg)     Height 6' (1.829 m)     Head Circumference      Peak Flow      Pain Score      Pain Loc      Pain Edu?      Excl. in [place]?     Constitutional: Alert, moderately ill-appearing, mild distress. Eyes: Conjunctivae are normal. Normal extraocular movements. ENT   Head: Normocephalic and atraumatic.   Nose: No congestion/rhinnorhea.   Mouth/Throat: Mucous membranes are moist.   Neck: No stridor. Cardiovascular: Normal rate, regular rhythm. No murmurs, rubs, or gallops. Respiratory: Tachypnea with bilateral rhonchi and rales.  Some wheezing is noted Gastrointestinal: Soft and nontender. Normal bowel sounds Musculoskeletal: Nontender with normal range of motion in extremities.  Bilateral edema is noted Neurologic:  Normal speech and language. No gross focal neurologic deficits are appreciated.  Skin:  Skin is warm, dry and intact. No rash noted. Psychiatric: Mood and affect are normal. Speech and behavior are normal.  ____________________________________________  EKG: Interpreted by me.  Sinus rhythm rate of 95 bpm, normal PR interval, wide QRS, normal QT.  Left axis deviation  ____________________________________________  ED COURSE:  As part of my  medical decision making, I reviewed the following data within the [place] History obtained from family if available, nursing notes, old chart and ekg, as well as notes from prior ED visits. Patient presented for respiratory distress, we will assess with labs and imaging as indicated at this time.   Procedures ____________________________________________   LABS (pertinent positives/negatives)  Labs Reviewed  CBC WITH DIFFERENTIAL/PLATELET - Abnormal; Notable for the following components:      Result Value   RBC 4.12 (*)    Hemoglobin 11.9 (*)    HCT 35.4 (*)    RDW 15.6 (*)    All other components within normal limits  BASIC METABOLIC PANEL - Abnormal; Notable for the following components:   Glucose, Bld 192 (*)    BUN 34 (*)    Creatinine, Ser 1.38 (*)    Calcium 8.6 (*)    GFR calc non Af Amer 43 (*)    GFR calc Af Amer 50 (*)    All other components within normal limits  TROPONIN I - Abnormal; Notable for the following components:   Troponin I 0.11 (*)    All other components within normal limits  LACTIC ACID, PLASMA - Abnormal; Notable for the following components:   Lactic Acid, Venous 2.7 (*)    All other components within normal limits  CULTURE, BLOOD (ROUTINE X 2)  CULTURE, BLOOD (ROUTINE X 2)  BLOOD GAS, VENOUS  BRAIN NATRIURETIC PEPTIDE   CRITICAL CARE Performed by: Gaelan, Glennon   Total critical care time: 30 minutes  Critical care time was exclusive of separately billable procedures and treating other patients.  Critical care was necessary to treat or prevent imminent or life-threatening deterioration.  Critical care was time spent personally by me on the following activities: development of treatment plan with patient and/or surrogate as well as nursing, discussions with consultants, evaluation of patient's response to treatment, examination of patient, obtaining history from patient or surrogate, ordering and performing treatments  and interventions, ordering and review of laboratory studies, ordering and review of radiographic studies, pulse oximetry and re-evaluation of patient's condition.  RADIOLOGY Images were viewed by me  Chest x-ray Reveals right middle lobe pneumonia ____________________________________________  DIFFERENTIAL DIAGNOSIS   Pneumonia, URI, pleural effusion, pneumothorax, PE, CHF  FINAL ASSESSMENT AND PLAN  Respiratory distress, elevated lactic acid level, elevated troponin, pneumonia   Plan: Patient had presented for respiratory distress. Patient's labs were concerning for elevated lactic acid level and elevated troponin. Patient's imaging revealed a right middle lobe infiltrate for which we have placed him on broad-spectrum antibiotics including vancomycin and Zosyn.  He will be given IV fluid bolus as well.  Family has requested transfer to the [place], [place]   Note: This note was generated in part or whole with voice recognition software. Voice recognition is usually quite accurate but there are transcription errors that can and very often do occur. I apologize for any typographical errors that were not detected and corrected.     Earleen Newport,  MD 01/13/17 1439

## 2017-01-13 NOTE — ED Notes (Signed)
[phone] [name] (daughter)  [phone] Quita Skye (son)

## 2017-01-13 NOTE — ED Triage Notes (Signed)
Patient presents to ED via [facility] from home with c/o SOB. Hx of pneumonia and COPD. Patient SOB on arrival with auditory expiratory wheezes.

## 2017-01-13 NOTE — ED Notes (Signed)
Soiled brief changed at this time. Family remains at bedside.

## 2017-01-14 LAB — BASIC METABOLIC PANEL
Anion gap: 7 (ref 5–15)
BUN: 32 mg/dL — ABNORMAL HIGH (ref 6–20)
CO2: 27 mmol/L (ref 22–32)
CREATININE: 1.53 mg/dL — AB (ref 0.61–1.24)
Calcium: 8.2 mg/dL — ABNORMAL LOW (ref 8.9–10.3)
Chloride: 102 mmol/L (ref 101–111)
GFR calc non Af Amer: 38 mL/min — ABNORMAL LOW (ref >60)
GFR, EST AFRICAN AMERICAN: 44 mL/min — AB (ref >60)
Glucose, Bld: 174 mg/dL — ABNORMAL HIGH (ref 65–99)
POTASSIUM: 4.4 mmol/L (ref 3.5–5.1)
Sodium: 136 mmol/L (ref 135–145)

## 2017-01-14 LAB — CBC
HEMATOCRIT: 34.4 % — AB (ref 40.0–52.0)
HEMOGLOBIN: 11.2 g/dL — AB (ref 13.0–18.0)
MCH: 28 pg (ref 26.0–34.0)
MCHC: 32.6 g/dL (ref 32.0–36.0)
MCV: 85.9 fL (ref 80.0–100.0)
PLATELETS: 198 10*3/uL (ref 150–440)
RBC: 4 MIL/uL — AB (ref 4.40–5.90)
RDW: 15.7 % — ABNORMAL HIGH (ref 11.5–14.5)
WBC: 6.4 10*3/uL (ref 3.8–10.6)

## 2017-01-14 LAB — GLUCOSE, CAPILLARY
GLUCOSE-CAPILLARY: 241 mg/dL — AB (ref 65–99)
Glucose-Capillary: 174 mg/dL — ABNORMAL HIGH (ref 65–99)
Glucose-Capillary: 217 mg/dL — ABNORMAL HIGH (ref 65–99)
Glucose-Capillary: 239 mg/dL — ABNORMAL HIGH (ref 65–99)

## 2017-01-14 MED ORDER — QUETIAPINE FUMARATE 25 MG PO TABS
25.0000 mg | ORAL_TABLET | Freq: Every evening | ORAL | Status: DC | PRN
  Filled 2017-01-14: qty 1

## 2017-01-14 MED ORDER — BUDESONIDE 0.25 MG/2ML IN SUSP
0.2500 mg | Freq: Two times a day (BID) | RESPIRATORY_TRACT | Status: DC
  Administered 2017-01-14 – 2017-01-17 (×6): 0.25 mg via RESPIRATORY_TRACT
  Filled 2017-01-14 (×6): qty 2

## 2017-01-14 NOTE — Plan of Care (Signed)
  Progressing SLP Dysphagia Goals Patient will utilize recommended strategies Description Patient will utilize recommended strategies during swallow to increase swallowing safety with 01/14/2017 1519 - Progressing by Colon Flattery B, CCC-SLP Flowsheets Taken 01/14/2017 1519  Patient will utilize recommended strategies during swallow to increase swallowing safety with  mod assist;min assist Misc Dysphagia Goal 01/14/2017 1519 - Progressing by Colon Flattery B, CCC-SLP Flowsheets Taken 01/14/2017 1519  Misc Dysphagia Goal  Pt will tolerate least restrictive diet without overt s/s aspiration or decline in respiratory status.

## 2017-01-14 NOTE — Care Management Important Message (Signed)
Important Message  Patient Details  Name: Roberto Griffin MRN: [id number] Date of Birth: 07-22-1924   Medicare Important Message Given:  Yes Signed IM notice given    Katrina Stack, RN 01/14/2017, 8:27 AM

## 2017-01-14 NOTE — Care Management (Signed)
Patient is currently followed by [facility].  Agency to contact CM

## 2017-01-14 NOTE — Care Management (Signed)
Patient is followed by hospice for cardiac issues.  He does have oxygen in the home.  Over the last couple of weeks, patient has been experiencing chest congestion and has been treated with Levaquin.  The hospice nurse has been in close contact with patient and family.  She sent patient to hospital as he was having extreme respiratory difficulty

## 2017-01-14 NOTE — Progress Notes (Signed)
Nutrition Brief Note  Patient identified on the Malnutrition Screening Tool (MST) Report  Wt Readings from Last 15 Encounters:  01/13/17 198 lb (89.8 kg)  10/20/14 223 lb (101.2 kg)  10/10/14 215 lb 14.4 oz (97.9 kg)  08/29/14 209 lb 1.6 oz (94.8 kg)  03/03/12 213 lb 6.5 oz (96.8 kg)   Mr. Matuszak comes in with PMH Stroke, DM, HTN, GERD, CKD III, presents with SOB. Was on home hospice. PO 100%, unsure of weight loss but NFPE WNL. No needs at this time  Body mass index is 26.85 kg/m. Patient meets criteria for overweight based on current BMI.   Current diet order is heart healthy, patient is consuming approximately 100% of meals at this time. Labs and medications reviewed.   No nutrition interventions warranted at this time. If nutrition issues arise, please consult RD.   Satira Anis. Derrien Anschutz, MS, RD LDN Inpatient Clinical Dietitian Pager [phone]

## 2017-01-14 NOTE — Evaluation (Signed)
Clinical/Bedside Swallow Evaluation Patient Details  Name: Roberto Griffin MRN: [id number] Date of Birth: [birth year]  Today's Date: 01/14/2017 Time: SLP Start Time (ACUTE ONLY): 1450 SLP Stop Time (ACUTE ONLY): 1510 SLP Time Calculation (min) (ACUTE ONLY): 20 min  Past Medical History:  Past Medical History:  Diagnosis Date  . BPH (benign prostatic hyperplasia)   . CKD (chronic kidney disease), stage III ([place])   . CVA (cerebral infarction)   . Diabetes mellitus without complication ([place])   . GERD (gastroesophageal reflux disease)   . Hypertension    Past Surgical History:  Past Surgical History:  Procedure Laterality Date  . APPENDECTOMY    . CHOLECYSTECTOMY     HPI:  84 year old male admitted 01/13/17 with SOB. PMH significant for CVA, DM, HTN, GERD, CKD3, chronic respiratory failure due to COPD. Daughter reports pt has been on nectar thick liquids since October.    Assessment / Plan / Recommendation Clinical Impression  Pt presents with mild-moderate dysarthria due to oral motor weakness, possibly due to history of CVA. Pt has adequate natural dentition. Pt accepted trials of thin liquid, nectar thick liquid, puree, and solid. Immediate cough response was noted after presentation of thin liquid, at which time pt's daughter informed SLP that pt had been on nectar thick liquids since October 2018. Nectar thick, puree, and solid consistencies were tolerated without overt s/s aspiration.  Recommend continuing regular solids, but downgrade liquids to nectar thick consistency. Also recommend completion of MBS to objectively reassess swallow function and safety and identify least restrictive diet. Pt and daughter are agreeable to this recommendation. RN and MD informed of results and recommendations. Will complete MBS as soon as able.    SLP Visit Diagnosis: Dysphagia, unspecified (R13.10)    Aspiration Risk  Moderate aspiration risk    Diet Recommendation Regular;Nectar-thick liquid    Liquid Administration via: Cup Medication Administration: Whole meds with liquid Supervision: Patient able to self feed Compensations: Minimize environmental distractions;Slow rate;Small sips/bites Postural Changes: Remain upright for at least 30 minutes after po intake;Seated upright at 90 degrees    Other  Recommendations Oral Care Recommendations: Oral care BID Other Recommendations: Order thickener from pharmacy;Remove water pitcher   Follow up Recommendations (TBD) pending MBS     Frequency and Duration Pending MBS          Prognosis Prognosis for Safe Diet Advancement: Fair      Swallow Study   General Date of Onset: 01/13/17 HPI: 84 year old male admitted 01/13/17 with SOB. PMH significant for CVA, DM, HTN, GERD, CKD3, chronic respiratory failure due to COPD. Daughter reports pt has been on nectar thick liquids since October.  Type of Study: Bedside Swallow Evaluation Previous Swallow Assessment: none found Diet Prior to this Study: Regular;Thin liquids Temperature Spikes Noted: No Respiratory Status: Nasal cannula History of Recent Intubation: No Behavior/Cognition: Alert;Cooperative;Pleasant mood Oral Cavity Assessment: Within Functional Limits Oral Care Completed by SLP: No Oral Cavity - Dentition: Adequate natural dentition Vision: Functional for self-feeding Self-Feeding Abilities: Able to feed self Patient Positioning: Upright in chair Baseline Vocal Quality: Normal Volitional Cough: Weak Volitional Swallow: Able to elicit    Oral/Motor/Sensory Function Overall Oral Motor/Sensory Function: Mild impairment(generalized weakness)   Ice Chips Ice chips: Not tested   Thin Liquid Thin Liquid: Impaired Presentation: Cup Pharyngeal  Phase Impairments: Cough - Immediate    Nectar Thick Nectar Thick Liquid: Within functional limits Presentation: Cup   Honey Thick Honey Thick Liquid: Not tested   Puree Puree:  Within functional limits Presentation: Spoon    Solid   GO   Solid: Within functional limits Presentation: [employer] Ore, [facility] [place], [place] Speech Language Pathologist [phone]  Shonna Chock 01/14/2017,3:17 PM

## 2017-01-14 NOTE — Evaluation (Signed)
Physical Therapy Evaluation Patient Details Name: Roberto Griffin MRN: [id number] DOB: May 07, 1924 Today's Date: 01/14/2017   History of Present Illness  Pt is a 84 y/o M who presented with worsening SOB.  Chest x-ray revealed R upper lobe infiltrate.  Pt's PMH includes CKD, CVA.     Clinical Impression  Pt admitted with above diagnosis. Pt currently with functional limitations due to the deficits listed below (see PT Problem List). Per chart review the pt ambulates short household distances with RW but recently has been unable to perform stand pivot to [facility].  Pt currently requires mod assist for bed mobility, sit<>stand, and stand pivot transfers.  Bil partial knee buckling noted with stand pivot transfer.  Given pt's current mobility status, recommending SNF at d/c.  Pt will benefit from skilled PT to increase their independence and safety with mobility to allow discharge to the venue listed below.      Follow Up Recommendations SNF    Equipment Recommendations  None recommended by PT    Recommendations for Other Services       Precautions / Restrictions Precautions Precautions: Fall;Other (comment) Precaution Comments: O2 Restrictions Weight Bearing Restrictions: No      Mobility  Bed Mobility Overal bed mobility: Needs Assistance Bed Mobility: Supine to Sit     Supine to sit: Mod assist;HOB elevated     General bed mobility comments: Pt requires assist to elevate trunk and use of bed pad to scoot to EOB.  Pt with heavy use of bed rail.  Requires cues for sequencing and cues to remain focused on task.   Transfers Overall transfer level: Needs assistance Equipment used: Rolling walker (2 wheeled) Transfers: Sit to/from [facility] Sit to Stand: Mod assist;From elevated surface Stand pivot transfers: Mod assist       General transfer comment: Bed elevated as pt unable to stand from regular height.  Verbal and tactile cues for proper hand placement.   Assist to boost to standing and to remain steady as pt with posterior bias.  Pt braces posterior LEs against side of bed to assist in stabilizing.  When pivoting to chair the pt requires cues for upright posture and assist to remain steady with assist to manage RW.  Bil partial knee buckling noted.  Pt sits prematurely and does not control descent to sit.   Ambulation/Gait             General Gait Details: Unable to attempt  Stairs            Wheelchair Mobility    Modified Rankin (Stroke Patients Only)       Balance Overall balance assessment: Needs assistance;History of Falls Sitting-balance support: Feet supported;Single extremity supported Sitting balance-Leahy Scale: Poor Sitting balance - Comments: Relies on at least 1UE support to maintain balance sitting EOB Postural control: Posterior lean Standing balance support: Bilateral upper extremity supported;During functional activity Standing balance-Leahy Scale: Poor Standing balance comment: Relies on BUE support in static standing and with stand pivot transfer                             Pertinent Vitals/Pain Pain Assessment: Faces Faces Pain Scale: No hurt Pain Intervention(s): Monitored during session    Home Living Family/patient expects to be discharged to:: Private residence Living Arrangements: Alone   Type of Home: House Home Access: Stairs to enter   [employer] of Steps: 2 Home Layout: One level Home Equipment: Environmental consultant -  2 wheels      Prior Function Level of Independence: Needs assistance   Gait / Transfers Assistance Needed: Per chart review the pt at baseline ambulates short household distances with RW.  Recently has been unable to pivot to [facility].  Per RN the pt has had recent falls at home.   ADL's / Homemaking Assistance Needed: Pt likely required assist with ADLs.          Hand Dominance        Extremity/Trunk Assessment   Upper Extremity Assessment Upper  Extremity Assessment: Generalized weakness    Lower Extremity Assessment Lower Extremity Assessment: Generalized weakness    Cervical / Trunk Assessment Cervical / Trunk Assessment: Kyphotic  Communication   Communication: Other (comment)(difficult to understand at times as pt mumbles)  Cognition Arousal/Alertness: Awake/alert Behavior During Therapy: Flat affect Overall Cognitive Status: No family/caregiver present to determine baseline cognitive functioning Area of Impairment: Orientation;Following commands;Attention;Safety/judgement;Problem solving                 Orientation Level: Disoriented to;Place;Time;Situation Current Attention Level: Selective   Following Commands: Follows one step commands with increased time(and once repeated) Safety/Judgement: Decreased awareness of safety;Decreased awareness of deficits   Problem Solving: Slow processing;Decreased initiation;Difficulty sequencing;Requires verbal cues;Requires tactile cues General Comments: Unsure of pt's baseline      General Comments General comments (skin integrity, edema, etc.): SpO2 remains at or above 91% on 2L O2 throughout session.  Pt declines therapeutic exercises after stand pivot transfer.     Exercises     Assessment/Plan    PT Assessment Patient needs continued PT services  PT Problem List Decreased strength;Decreased activity tolerance;Decreased balance;Decreased mobility;Decreased cognition;Decreased knowledge of use of DME;Decreased safety awareness;Cardiopulmonary status limiting activity       PT Treatment Interventions DME instruction;Gait training;Stair training;Functional mobility training;Therapeutic activities;Therapeutic exercise;Balance training;Neuromuscular re-education;Cognitive remediation;Wheelchair mobility training;Patient/family education    PT Goals (Current goals can be found in the Care Plan section)  Acute Rehab PT Goals Patient Stated Goal: Pt unable to  participate in goal setting PT Goal Formulation: Patient unable to participate in goal setting Time For Goal Achievement: 01/28/17 Potential to Achieve Goals: Good    Frequency Min 2X/week   Barriers to discharge Other (comment) Unsure of amount of assist available     Co-evaluation               AM-PAC PT "6 Clicks" Daily Activity  Outcome Measure Difficulty turning over in bed (including adjusting bedclothes, sheets and blankets)?: Unable Difficulty moving from lying on back to sitting on the side of the bed? : Unable Difficulty sitting down on and standing up from a chair with arms (e.g., wheelchair, bedside commode, etc,.)?: Unable Help needed moving to and from a bed to chair (including a wheelchair)?: A Lot Help needed walking in hospital room?: A Lot Help needed climbing 3-5 steps with a railing? : Total 6 Click Score: 8    End of Session Equipment Utilized During Treatment: Gait belt;Oxygen Activity Tolerance: Patient limited by fatigue Patient left: in chair;with call bell/phone within reach;with chair alarm set Nurse Communication: Mobility status;Other (comment)(SpO2) PT Visit Diagnosis: Unsteadiness on feet (R26.81);Other abnormalities of gait and mobility (R26.89);Muscle weakness (generalized) (M62.81);Difficulty in walking, not elsewhere classified (R26.2)    Time: 2956-2130 PT Time Calculation (min) (ACUTE ONLY): 21 min   Charges:   PT Evaluation $PT Eval Low Complexity: 1 Low     PT G Codes:       Collie Siad PT,  DPT 01/14/2017, 10:58 AM

## 2017-01-14 NOTE — Care Management (Signed)
Contact person is Lenox Ponds with [facility]  [phone] if patient discharges.

## 2017-01-14 NOTE — Progress Notes (Signed)
[place] at [facility]                                                                                                                                                                                  Patient Demographics   Roberto Griffin, is a 84 y.o. male, DOB - 14-Jun-1924, UEA:[id number]  Admit date - 01/13/2017   Admitting Physician Gladstone Lighter, MD  Outpatient Primary MD for the patient is Morton Peters., MD   LOS - 1  Subjective: Patient admitted with shortness of breath and cough Continues to have cough and congestion  Review of Systems:   CONSTITUTIONAL: No documented fever. No fatigue, weakness. No weight gain, no weight loss.  EYES: No blurry or double vision.  ENT: No tinnitus. No postnasal drip. No redness of the oropharynx.  RESPIRATORY: Positive cough, no wheeze, no hemoptysis.  Positive dyspnea.  CARDIOVASCULAR: No chest pain. No orthopnea. No palpitations. No syncope.  GASTROINTESTINAL: No nausea, no vomiting or diarrhea. No abdominal pain. No melena or hematochezia.  GENITOURINARY: No dysuria or hematuria.  ENDOCRINE: No polyuria or nocturia. No heat or cold intolerance.  HEMATOLOGY: No anemia. No bruising. No bleeding.  INTEGUMENTARY: No rashes. No lesions.  MUSCULOSKELETAL: No arthritis. No swelling. No gout.  NEUROLOGIC: No numbness, tingling, or ataxia. No seizure-type activity.  PSYCHIATRIC: No anxiety. No insomnia. No ADD.    Vitals:   Vitals:   01/14/17 0027 01/14/17 0342 01/14/17 0446 01/14/17 1224  BP:  120/71  138/77  Pulse: 66 73 66 79  Resp: 18 20 18 18   Temp:  98 F (36.7 C)  97.7 F (36.5 C)  TempSrc:  Oral  Oral  SpO2: 92% 96% 96% 98%  Weight:      Height:        Wt Readings from Last 3 Encounters:  01/13/17 198 lb (89.8 kg)  10/20/14 223 lb (101.2 kg)  10/10/14 215 lb 14.4 oz (97.9 kg)     Intake/Output Summary (Last 24 hours) at 01/14/2017 1348 Last data filed at 01/14/2017 1039 Gross per 24  hour  Intake 1620 ml  Output -  Net 1620 ml    Physical Exam:   GENERAL: Pleasant-appearing in no apparent distress.  HEAD, EYES, EARS, NOSE AND THROAT: Atraumatic, normocephalic. Extraocular muscles are intact. Pupils equal and reactive to light. Sclerae anicteric. No conjunctival injection. No oro-pharyngeal erythema.  NECK: Supple. There is no jugular venous distention. No bruits, no lymphadenopathy, no thyromegaly.  HEART: Regular rate and rhythm,. No murmurs, no rubs, no clicks.  LUNGS: Bilateral wheezing as well as rhonchus breath sounds ABDOMEN: Soft, flat, nontender, nondistended. Has good bowel sounds. No hepatosplenomegaly  appreciated.  EXTREMITIES: No evidence of any cyanosis, clubbing, or peripheral edema.  +2 pedal and radial pulses bilaterally.  NEUROLOGIC: The patient is alert, awake, and oriented x3 with no focal motor or sensory deficits appreciated bilaterally.  SKIN: Moist and warm with no rashes appreciated.  Psych: Not anxious, depressed LN: No inguinal LN enlargement    Antibiotics   Anti-infectives (From admission, onward)   Start     Dose/Rate Route Frequency Ordered Stop   01/13/17 2000  azithromycin (ZITHROMAX) 500 mg in dextrose 5 % 250 mL IVPB     500 mg 250 mL/hr over 60 Minutes Intravenous Every 24 hours 01/13/17 1853     01/13/17 1915  cefTRIAXone (ROCEPHIN) 2 g in dextrose 5 % 50 mL IVPB     2 g 100 mL/hr over 30 Minutes Intravenous Every 24 hours 01/13/17 1902     01/13/17 1445  vancomycin (VANCOCIN) IVPB 1000 mg/200 mL premix     1,000 mg 200 mL/hr over 60 Minutes Intravenous  Once 01/13/17 1435 01/13/17 1550   01/13/17 1445  piperacillin-tazobactam (ZOSYN) IVPB 3.375 g     3.375 g 100 mL/hr over 30 Minutes Intravenous  Once 01/13/17 1435 01/13/17 1520      Medications   Scheduled Meds: . amLODipine  10 mg Oral Daily  . budesonide (PULMICORT) nebulizer solution  0.25 mg Nebulization BID  . enoxaparin (LOVENOX) injection  40 mg  Subcutaneous Q24H  . finasteride  5 mg Oral Daily  . guaiFENesin  600 mg Oral BID  . insulin aspart  0-5 Units Subcutaneous QHS  . insulin aspart  0-9 Units Subcutaneous TID WC  . ipratropium-albuterol  3 mL Nebulization Q4H  . lisinopril  10 mg Oral Daily  . metFORMIN  250 mg Oral Q breakfast  . methylPREDNISolone (SOLU-MEDROL) injection  60 mg Intravenous Q8H  . metoprolol tartrate  25 mg Oral BID  . multivitamin with minerals  1 tablet Oral Daily  . pantoprazole  40 mg Oral Daily  . tamsulosin  0.4 mg Oral Daily   Continuous Infusions: . azithromycin Stopped (01/13/17 2125)  . cefTRIAXone (ROCEPHIN)  IV Stopped (01/13/17 2055)   PRN Meds:.acetaminophen **OR** acetaminophen, ondansetron **OR** ondansetron (ZOFRAN) IV, oxyCODONE, polyethylene glycol   Data Review:   Micro Results Recent Results (from the past 240 hour(s))  Blood culture (routine x 2)     Status: None (Preliminary result)   Collection Time: 01/13/17  1:35 PM  Result Value Ref Range Status   Specimen Description BLOOD LEFT ANTECUBITAL  Final   Special Requests   Final    BOTTLES DRAWN AEROBIC AND ANAEROBIC Blood Culture adequate volume   Culture   Final    NO GROWTH < 24 HOURS Performed at [facility], [address] [place], [place] [address]    Report Status PENDING  Incomplete  Blood culture (routine x 2)     Status: None (Preliminary result)   Collection Time: 01/13/17  1:35 PM  Result Value Ref Range Status   Specimen Description BLOOD BLOOD LEFT ARM  Final   Special Requests   Final    BOTTLES DRAWN AEROBIC AND ANAEROBIC Blood Culture adequate volume   Culture   Final    NO GROWTH < 24 HOURS Performed at [facility], [address], [place], [place] [address]    Report Status PENDING  Incomplete    Radiology Reports Dg Chest Port 1 View  Result Date: 01/13/2017 CLINICAL DATA:  Shortness of breath. EXAM:  PORTABLE CHEST 1 VIEW COMPARISON:  CT 10/20/2014.  Chest x-ray  08/29/2014 . FINDINGS: Stable mediastinal fullness consistent with prominent great vessels. Stable cardiomegaly. Mild right upper lobe infiltrate. No pleural effusion or pneumothorax. Degenerative changes thoracic spine . IMPRESSION: Mild right upper lobe infiltrate suggesting pneumonia. Close follow-up chest x-rays to demonstrate clearing suggested. Electronically Signed   By: Marcello Moores  Register   On: 01/13/2017 14:23     CBC Recent Labs  Lab 01/13/17 1335 01/14/17 0514  WBC 7.2 6.4  HGB 11.9* 11.2*  HCT 35.4* 34.4*  PLT 218 198  MCV 85.8 85.9  MCH 28.9 28.0  MCHC 33.6 32.6  RDW 15.6* 15.7*  LYMPHSABS 1.9  --   MONOABS 0.8  --   EOSABS 0.0  --   BASOSABS 0.0  --     Chemistries  Recent Labs  Lab 01/13/17 1335 01/14/17 0514  NA 137 136  K 4.1 4.4  CL 104 102  CO2 25 27  GLUCOSE 192* 174*  BUN 34* 32*  CREATININE 1.38* 1.53*  CALCIUM 8.6* 8.2*   ------------------------------------------------------------------------------------------------------------------ estimated creatinine clearance is 35.2 mL/min (A) (by C-G formula based on SCr of 1.53 mg/dL (H)). ------------------------------------------------------------------------------------------------------------------ No results for input(s): HGBA1C in the last 72 hours. ------------------------------------------------------------------------------------------------------------------ No results for input(s): CHOL, HDL, LDLCALC, TRIG, CHOLHDL, LDLDIRECT in the last 72 hours. ------------------------------------------------------------------------------------------------------------------ No results for input(s): TSH, T4TOTAL, T3FREE, THYROIDAB in the last 72 hours.  Invalid input(s): FREET3 ------------------------------------------------------------------------------------------------------------------ No results for input(s): VITAMINB12, FOLATE, FERRITIN, TIBC, IRON, RETICCTPCT in the last 72 hours.  Coagulation  profile No results for input(s): INR, PROTIME in the last 168 hours.  No results for input(s): DDIMER in the last 72 hours.  Cardiac Enzymes Recent Labs  Lab 01/13/17 1335  TROPONINI 0.11*   ------------------------------------------------------------------------------------------------------------------ Invalid input(s): POCBNP    Assessment & Plan   Roberto Griffin  is a 84 y.o. male with a known history of stroke, and diabetes mellitus, hypertension, GERD, CK D stage III brought in from home secondary to worsening shortness of breath for almost a week now.  1. Community acquired pneumonia-chest x-ray with right upper lobe infiltrate. -Started on Rocephin and azithromycin. Speech eval Continue Mucinex  2. Elevated troponin-likely demand ischemia secondary Oxygen respiratory distress.  No further workup needed  3.  Acute on chronic COPD exacerbation continue on steroids and DuoNeb's. Also inhalers and monitor. -Currently requiring 2 L oxygen by nasal cannula which is his chronic requirement.  4. Acute renal insufficiency on CK D- though BUN and creatinine are elevated,  Patient's renal function was slightly worse after Lasix I will repeat BMP tomorrow may need some fluids  5. Diabetes mellitus-due to elevated creatinine discontinue metformin  6. Hypertension-continue metoprolol, Norvasc and lisinopril  7. DVT prophylaxis-Lovenox       Code Status Orders  (From admission, onward)        Start     Ordered   01/13/17 1854  Do not attempt resuscitation (DNR)  Continuous    Question Answer Comment  In the event of cardiac or respiratory ARREST Do not call a "code blue"   In the event of cardiac or respiratory ARREST Do not perform Intubation, CPR, defibrillation or ACLS   In the event of cardiac or respiratory ARREST Use medication by any route, position, wound care, and other measures to relive pain and suffering. May use oxygen, suction and manual treatment of  airway obstruction as needed for comfort.      01/13/17 1853    Code  Status History    Date Active Date Inactive Code Status Order ID Comments User Context   10/10/2014 20:04 10/11/2014 20:27 DNR [id number]  Gladstone Lighter, MD Inpatient   08/29/2014 23:39 08/30/2014 21:06 Full Code [id number]  Lance Coon, MD Inpatient   03/02/2012 21:35 03/04/2012 18:03 Full Code [id number]  Leone Haven, MD Inpatient    Advance Directive Documentation     Most Recent Value  Type of Advance Directive  Healthcare Power of Attorney  Pre-existing out of facility DNR order (yellow form or pink MOST form)  Physician notified to receive inpatient order, Yellow form placed in chart (order not valid for inpatient use)  "MOST" Form in Place?  No data           Consults none  DVT Prophylaxis  Lovenox   Lab Results  Component Value Date   PLT 198 01/14/2017     Time Spent in minutes 35 minutes  Greater than 50% of time spent in care coordination and counseling patient regarding the condition and plan of care.   Dustin Flock M.D on 01/14/2017 at 1:48 PM  Between 7am to 6pm - Pager - [phone]  After 6pm go to [email or web address] - password [email or web address] [place] [facility]   Office  [phone]

## 2017-01-14 NOTE — Clinical Social Work Note (Signed)
Patient is currently receiving hospice services at home with [facility], case manager can assist with returning home with hospice.  CSW to sign off please reconsult if social work needs arise.  Jones Broom. Norval Morton, MSW, [place]  01/14/2017 3:39 PM

## 2017-01-15 LAB — GLUCOSE, CAPILLARY
GLUCOSE-CAPILLARY: 169 mg/dL — AB (ref 65–99)
GLUCOSE-CAPILLARY: 160 mg/dL — AB (ref 65–99)
Glucose-Capillary: 160 mg/dL — ABNORMAL HIGH (ref 65–99)
Glucose-Capillary: 200 mg/dL — ABNORMAL HIGH (ref 65–99)

## 2017-01-15 LAB — BASIC METABOLIC PANEL
ANION GAP: 13 (ref 5–15)
BUN: 46 mg/dL — ABNORMAL HIGH (ref 6–20)
CALCIUM: 8.4 mg/dL — AB (ref 8.9–10.3)
CO2: 24 mmol/L (ref 22–32)
CREATININE: 1.48 mg/dL — AB (ref 0.61–1.24)
Chloride: 99 mmol/L — ABNORMAL LOW (ref 101–111)
GFR, EST AFRICAN AMERICAN: 46 mL/min — AB (ref >60)
GFR, EST NON AFRICAN AMERICAN: 40 mL/min — AB (ref >60)
Glucose, Bld: 170 mg/dL — ABNORMAL HIGH (ref 65–99)
Potassium: 3.8 mmol/L (ref 3.5–5.1)
SODIUM: 136 mmol/L (ref 135–145)

## 2017-01-15 LAB — CBC
HCT: 34.2 % — ABNORMAL LOW (ref 40.0–52.0)
Hemoglobin: 11.5 g/dL — ABNORMAL LOW (ref 13.0–18.0)
MCH: 28.7 pg (ref 26.0–34.0)
MCHC: 33.6 g/dL (ref 32.0–36.0)
MCV: 85.3 fL (ref 80.0–100.0)
PLATELETS: 200 10*3/uL (ref 150–440)
RBC: 4.01 MIL/uL — ABNORMAL LOW (ref 4.40–5.90)
RDW: 15.2 % — AB (ref 11.5–14.5)
WBC: 5.2 10*3/uL (ref 3.8–10.6)

## 2017-01-15 MED ORDER — LORAZEPAM 2 MG/ML IJ SOLN
1.0000 mg | Freq: Once | INTRAMUSCULAR | Status: AC
  Administered 2017-01-15: 01:00:00 1 mg via INTRAVENOUS
  Filled 2017-01-15: qty 1

## 2017-01-15 MED ORDER — INSULIN GLARGINE 100 UNIT/ML ~~LOC~~ SOLN
15.0000 [IU] | Freq: Every day | SUBCUTANEOUS | Status: DC
  Administered 2017-01-15 – 2017-01-16 (×2): 15 [IU] via SUBCUTANEOUS
  Filled 2017-01-15 (×3): qty 0.15

## 2017-01-15 MED ORDER — IPRATROPIUM-ALBUTEROL 0.5-2.5 (3) MG/3ML IN SOLN
3.0000 mL | Freq: Four times a day (QID) | RESPIRATORY_TRACT | Status: DC
  Administered 2017-01-15 (×2): 3 mL via RESPIRATORY_TRACT
  Filled 2017-01-15 (×2): qty 3

## 2017-01-15 NOTE — Therapy (Signed)
  Speech Language Pathology  Patient Details Name: Roberto Griffin MRN: [id number] DOB: 09/27/1924 Today's Date: 01/15/2017   SLP consulted with NSG about pt status. NSG states he is able to take meds [facility] and does not complain of the liquids. Pt WFL with current diet.  Previous SLP recommended MBSS which may be beneficial. NSG notified to contact SLP if changes occur, slp will follow up in 1-2 days.        [place] Sauber 01/15/2017, 10:10 AM

## 2017-01-15 NOTE — Plan of Care (Signed)
Increasing agitation/anxiety during the night. Seroquel ordered prn at bedtime, pt refused to take. So, MD notified. Ativan given IVP for agitation with some improvement for a few hours.Pt kept requesting to go home. When son was in earlier pt was calm. Pt moved to room 117 close to nurses station due to impulsiveness. No complaints of pain. Pt awake now and requesting to get out of here. Will continue to monitor and redirect.  Education: Knowledge of General Education information will improve 01/15/2017 0413 - Not Progressing by Jeffie Pollock, RN   Health Behavior/Discharge Planning: Ability to manage health-related needs will improve 01/15/2017 0413 - Not Progressing by Jeffie Pollock, RN   Clinical Measurements: Ability to maintain clinical measurements within normal limits will improve 01/15/2017 0413 - Progressing by Jeffie Pollock, RN Will remain free from infection 01/15/2017 0413 - Progressing by Jeffie Pollock, RN Diagnostic test results will improve 01/15/2017 0413 - Progressing by Jeffie Pollock, RN Respiratory complications will improve 01/15/2017 0413 - Progressing by Jeffie Pollock, RN Cardiovascular complication will be avoided 01/15/2017 0413 - Progressing by Jeffie Pollock, RN   Activity: Risk for activity intolerance will decrease 01/15/2017 0413 - Not Progressing by Jeffie Pollock, RN   Nutrition: Adequate nutrition will be maintained 01/15/2017 0413 - Progressing by Jeffie Pollock, RN   Coping: Level of anxiety will decrease 01/15/2017 0413 - Not Progressing by Jeffie Pollock, RN   Elimination: Will not experience complications related to bowel motility 01/15/2017 0413 - Progressing by Jeffie Pollock, RN   Pain Managment: General experience of comfort will improve 01/15/2017 0413 - Progressing by Jeffie Pollock, RN   Safety: Ability to remain free from injury will improve 01/15/2017 0413 - Not Progressing by Jeffie Pollock, RN   Skin Integrity: Risk for impaired skin integrity will decrease 01/15/2017 0413 - Progressing by Jeffie Pollock, RN

## 2017-01-15 NOTE — Progress Notes (Signed)
Family concerned about possible change in mental status of pt. I have not noted any change during shift, but I did notify Dr by text page, and verbally over phone. Transferred Dr to pt room phone to speak with family.

## 2017-01-15 NOTE — Progress Notes (Signed)
[place] at [facility]                                                                                                                                                                                  Patient Demographics   Roberto Griffin, is a 84 y.o. male, DOB - 1924-02-15, EZM:[id number]  Admit date - 01/13/2017   Admitting Physician Gladstone Lighter, MD  Outpatient Primary MD for the patient is Morton Peters., MD   LOS - 2  Subjective: P patient confused unable to provide any review of systems  Review of Systems:   CONSTITUTIONAL: Confused  Vitals:   Vitals:   01/15/17 0402 01/15/17 0606 01/15/17 0820 01/15/17 1339  BP:  (!) 129/59  129/61  Pulse: 62 77  79  Resp: 18 16 16 20   Temp:  97.6 F (36.4 C) 97.6 F (36.4 C)   TempSrc:   Oral   SpO2: (!) 88% 92% 92% 90%  Weight:      Height:        Wt Readings from Last 3 Encounters:  01/13/17 198 lb (89.8 kg)  10/20/14 223 lb (101.2 kg)  10/10/14 215 lb 14.4 oz (97.9 kg)     Intake/Output Summary (Last 24 hours) at 01/15/2017 1430 Last data filed at 01/15/2017 1035 Gross per 24 hour  Intake 0 ml  Output 150 ml  Net -150 ml    Physical Exam:   GENERAL: Pleasant-appearing in no apparent distress.  HEAD, EYES, EARS, NOSE AND THROAT: Atraumatic, normocephalic. Extraocular muscles are intact. Pupils equal and reactive to light. Sclerae anicteric. No conjunctival injection. No oro-pharyngeal erythema.  NECK: Supple. There is no jugular venous distention. No bruits, no lymphadenopathy, no thyromegaly.  HEART: Regular rate and rhythm,. No murmurs, no rubs, no clicks.  LUNGS: Bilateral wheezing as well as rhonchus breath sounds ABDOMEN: Soft, flat, nontender, nondistended. Has good bowel sounds. No hepatosplenomegaly appreciated.  EXTREMITIES: No evidence of any cyanosis, clubbing, or peripheral edema.  +2 pedal and radial pulses bilaterally.  NEUROLOGIC: The patient is alert, awake, and  oriented x3 with no focal motor or sensory deficits appreciated bilaterally.  SKIN: Moist and warm with no rashes appreciated.  Psych: Not anxious, depressed LN: No inguinal LN enlargement    Antibiotics   Anti-infectives (From admission, onward)   Start     Dose/Rate Route Frequency Ordered Stop   01/13/17 2000  azithromycin (ZITHROMAX) 500 mg in dextrose 5 % 250 mL IVPB     500 mg 250 mL/hr over 60 Minutes Intravenous Every 24 hours 01/13/17 1853     01/13/17 1915  cefTRIAXone (ROCEPHIN) 2 g in dextrose 5 %  50 mL IVPB     2 g 100 mL/hr over 30 Minutes Intravenous Every 24 hours 01/13/17 1902     01/13/17 1445  vancomycin (VANCOCIN) IVPB 1000 mg/200 mL premix     1,000 mg 200 mL/hr over 60 Minutes Intravenous  Once 01/13/17 1435 01/13/17 1550   01/13/17 1445  piperacillin-tazobactam (ZOSYN) IVPB 3.375 g     3.375 g 100 mL/hr over 30 Minutes Intravenous  Once 01/13/17 1435 01/13/17 1520      Medications   Scheduled Meds: . amLODipine  10 mg Oral Daily  . budesonide (PULMICORT) nebulizer solution  0.25 mg Nebulization BID  . enoxaparin (LOVENOX) injection  40 mg Subcutaneous Q24H  . finasteride  5 mg Oral Daily  . guaiFENesin  600 mg Oral BID  . insulin aspart  0-5 Units Subcutaneous QHS  . insulin aspart  0-9 Units Subcutaneous TID WC  . ipratropium-albuterol  3 mL Nebulization Q6H  . lisinopril  10 mg Oral Daily  . methylPREDNISolone (SOLU-MEDROL) injection  60 mg Intravenous Q8H  . metoprolol tartrate  25 mg Oral BID  . multivitamin with minerals  1 tablet Oral Daily  . pantoprazole  40 mg Oral Daily  . tamsulosin  0.4 mg Oral Daily   Continuous Infusions: . azithromycin Stopped (01/14/17 2130)  . cefTRIAXone (ROCEPHIN)  IV Stopped (01/14/17 2000)   PRN Meds:.acetaminophen **OR** acetaminophen, ondansetron **OR** ondansetron (ZOFRAN) IV, oxyCODONE, polyethylene glycol, QUEtiapine   Data Review:   Micro Results Recent Results (from the past 240 hour(s))  Blood  culture (routine x 2)     Status: None (Preliminary result)   Collection Time: 01/13/17  1:35 PM  Result Value Ref Range Status   Specimen Description BLOOD LEFT ANTECUBITAL  Final   Special Requests   Final    BOTTLES DRAWN AEROBIC AND ANAEROBIC Blood Culture adequate volume   Culture   Final    NO GROWTH 2 DAYS Performed at [facility], [address] [place], [place] [address]    Report Status PENDING  Incomplete  Blood culture (routine x 2)     Status: None (Preliminary result)   Collection Time: 01/13/17  1:35 PM  Result Value Ref Range Status   Specimen Description BLOOD BLOOD LEFT ARM  Final   Special Requests   Final    BOTTLES DRAWN AEROBIC AND ANAEROBIC Blood Culture adequate volume   Culture   Final    NO GROWTH 2 DAYS Performed at [facility], [address], [place], [place] [address]    Report Status PENDING  Incomplete    Radiology Reports Dg Chest Port 1 View  Result Date: 01/13/2017 CLINICAL DATA:  Shortness of breath. EXAM: PORTABLE CHEST 1 VIEW COMPARISON:  CT 10/20/2014.  Chest x-ray 08/29/2014 . FINDINGS: Stable mediastinal fullness consistent with prominent great vessels. Stable cardiomegaly. Mild right upper lobe infiltrate. No pleural effusion or pneumothorax. Degenerative changes thoracic spine . IMPRESSION: Mild right upper lobe infiltrate suggesting pneumonia. Close follow-up chest x-rays to demonstrate clearing suggested. Electronically Signed   By: Marcello Moores  Register   On: 01/13/2017 14:23     CBC Recent Labs  Lab 01/13/17 1335 01/14/17 0514 01/15/17 0408  WBC 7.2 6.4 5.2  HGB 11.9* 11.2* 11.5*  HCT 35.4* 34.4* 34.2*  PLT 218 198 200  MCV 85.8 85.9 85.3  MCH 28.9 28.0 28.7  MCHC 33.6 32.6 33.6  RDW 15.6* 15.7* 15.2*  LYMPHSABS 1.9  --   --   MONOABS 0.8  --   --  EOSABS 0.0  --   --   BASOSABS 0.0  --   --     Chemistries  Recent Labs  Lab 01/13/17 1335 01/14/17 0514 01/15/17 0408  NA 137 136 136  K 4.1 4.4 3.8   CL 104 102 99*  CO2 25 27 24   GLUCOSE 192* 174* 170*  BUN 34* 32* 46*  CREATININE 1.38* 1.53* 1.48*  CALCIUM 8.6* 8.2* 8.4*   ------------------------------------------------------------------------------------------------------------------ estimated creatinine clearance is 36.4 mL/min (A) (by C-G formula based on SCr of 1.48 mg/dL (H)). ------------------------------------------------------------------------------------------------------------------ No results for input(s): HGBA1C in the last 72 hours. ------------------------------------------------------------------------------------------------------------------ No results for input(s): CHOL, HDL, LDLCALC, TRIG, CHOLHDL, LDLDIRECT in the last 72 hours. ------------------------------------------------------------------------------------------------------------------ No results for input(s): TSH, T4TOTAL, T3FREE, THYROIDAB in the last 72 hours.  Invalid input(s): FREET3 ------------------------------------------------------------------------------------------------------------------ No results for input(s): VITAMINB12, FOLATE, FERRITIN, TIBC, IRON, RETICCTPCT in the last 72 hours.  Coagulation profile No results for input(s): INR, PROTIME in the last 168 hours.  No results for input(s): DDIMER in the last 72 hours.  Cardiac Enzymes Recent Labs  Lab 01/13/17 1335  TROPONINI 0.11*   ------------------------------------------------------------------------------------------------------------------ Invalid input(s): POCBNP    Assessment & Plan   Roberto Griffin  is a 84 y.o. male with a known history of stroke, and diabetes mellitus, hypertension, GERD, CK D stage III brought in from home secondary to worsening shortness of breath for almost a week now.  1. Community acquired pneumonia-chest x-ray with right upper lobe infiltrate. -Continue Rocephin and azithromycin. Speech eval Continue Mucinex  2. Elevated troponin-likely  demand ischemia secondary Oxygen respiratory distress.  No further workup needed  3.  Acute on chronic COPD exacerbation continue on steroids and DuoNeb's. Also inhalers and monitor. Continue oxygen  4. Acute renal insufficiency on CK D- though BUN and creatinine are elevated,  Renal function stable  5. Diabetes mellitus-continue sliding scale insulin add low-dose Lantus  6. Hypertension-continue metoprolol, Norvasc and lisinopril  7. DVT prophylaxis-Lovenox       Code Status Orders  (From admission, onward)        Start     Ordered   01/13/17 1854  Do not attempt resuscitation (DNR)  Continuous    Question Answer Comment  In the event of cardiac or respiratory ARREST Do not call a "code blue"   In the event of cardiac or respiratory ARREST Do not perform Intubation, CPR, defibrillation or ACLS   In the event of cardiac or respiratory ARREST Use medication by any route, position, wound care, and other measures to relive pain and suffering. May use oxygen, suction and manual treatment of airway obstruction as needed for comfort.      01/13/17 1853    Code Status History    Date Active Date Inactive Code Status Order ID Comments User Context   10/10/2014 20:04 10/11/2014 20:27 DNR [id number]  Gladstone Lighter, MD Inpatient   08/29/2014 23:39 08/30/2014 21:06 Full Code [id number]  Lance Coon, MD Inpatient   03/02/2012 21:35 03/04/2012 18:03 Full Code [id number]  Leone Haven, MD Inpatient    Advance Directive Documentation     Most Recent Value  Type of Advance Directive  Healthcare Power of Attorney  Pre-existing out of facility DNR order (yellow form or pink MOST form)  Physician notified to receive inpatient order, Yellow form placed in chart (order not valid for inpatient use)  "MOST" Form in Place?  No data           Consults none  DVT Prophylaxis  Lovenox   Lab Results  Component Value Date   PLT 200 01/15/2017     Time Spent in minutes 32  minutes  Greater than 50% of time spent in care coordination and counseling patient regarding the condition and plan of care.   Dustin Flock M.D on 01/15/2017 at 2:30 PM  Between 7am to 6pm - Pager - [phone]  After 6pm go to [email or web address] - password [email or web address] [facility]   Office  [phone]

## 2017-01-16 ENCOUNTER — Inpatient Hospital Stay: Payer: 59

## 2017-01-16 LAB — GLUCOSE, CAPILLARY
GLUCOSE-CAPILLARY: 168 mg/dL — AB (ref 65–99)
Glucose-Capillary: 154 mg/dL — ABNORMAL HIGH (ref 65–99)
Glucose-Capillary: 172 mg/dL — ABNORMAL HIGH (ref 65–99)
Glucose-Capillary: 141 mg/dL — ABNORMAL HIGH (ref 65–99)
Glucose-Capillary: 153 mg/dL — ABNORMAL HIGH (ref 65–99)

## 2017-01-16 MED ORDER — IPRATROPIUM-ALBUTEROL 0.5-2.5 (3) MG/3ML IN SOLN
3.0000 mL | Freq: Three times a day (TID) | RESPIRATORY_TRACT | Status: DC
  Administered 2017-01-16 – 2017-01-17 (×5): 3 mL via RESPIRATORY_TRACT
  Filled 2017-01-16 (×5): qty 3

## 2017-01-16 MED ORDER — SODIUM CHLORIDE 0.9 % IV SOLN
3.0000 g | Freq: Four times a day (QID) | INTRAVENOUS | Status: DC
  Administered 2017-01-16 – 2017-01-17 (×4): 3 g via INTRAVENOUS
  Filled 2017-01-16 (×7): qty 3

## 2017-01-16 MED ORDER — IPRATROPIUM-ALBUTEROL 0.5-2.5 (3) MG/3ML IN SOLN: 3.0000 mL | Freq: Four times a day (QID) | RESPIRATORY_TRACT | Status: DC | PRN

## 2017-01-16 NOTE — Progress Notes (Signed)
Pt could be heard coughing excessively from nurses station during breakfast feed. Concerned of aspiration. MD ordered speech consult.

## 2017-01-16 NOTE — Progress Notes (Signed)
Due to aspiration concerns, and dining continuing to bring trays into the room I asked MD to make pt NPO. NPO status in place.

## 2017-01-16 NOTE — Progress Notes (Signed)
[place] at [facility]                                                                                                                                                                                  Patient Demographics   Roberto Griffin, is a 84 y.o. male, DOB - 05-23-1924, [id number]  Admit date - 01/13/2017   Admitting Physician Gladstone Lighter, MD  Outpatient Primary MD for the patient is Morton Peters., MD   LOS - 3  Subjective: She continues to be very confused coughing and short of breath  Review of Systems:   CONSTITUTIONAL: Confused  Vitals:   Vitals:   01/15/17 2003 01/15/17 2108 01/16/17 0529 01/16/17 0825  BP:   (!) 141/71 (!) 146/72  Pulse:  72 78 76  Resp:   19 18  Temp:   97.8 F (36.6 C) 97.8 F (36.6 C)  TempSrc:   Oral Oral  SpO2: 97%  94% 94%  Weight:      Height:        Wt Readings from Last 3 Encounters:  01/13/17 198 lb (89.8 kg)  10/20/14 223 lb (101.2 kg)  10/10/14 215 lb 14.4 oz (97.9 kg)     Intake/Output Summary (Last 24 hours) at 01/16/2017 1322 Last data filed at 01/16/2017 0859 Gross per 24 hour  Intake 120 ml  Output -  Net 120 ml    Physical Exam:   GENERAL: Chronically ill-appearing HEAD, EYES, EARS, NOSE AND THROAT: Atraumatic, normocephalic. Extraocular muscles are intact. Pupils equal and reactive to light. Sclerae anicteric. No conjunctival injection. No oro-pharyngeal erythema.  NECK: Supple. There is no jugular venous distention. No bruits, no lymphadenopathy, no thyromegaly.  HEART: Regular rate and rhythm,. No murmurs, no rubs, no clicks.  LUNGS: Rhonchus breath sounds bilaterally with accessory muscle usage ABDOMEN: Soft, flat, nontender, nondistended. Has good bowel sounds. No hepatosplenomegaly appreciated.  EXTREMITIES: No evidence of any cyanosis, clubbing, or peripheral edema.  +2 pedal and radial pulses bilaterally.  NEUROLOGIC: The patient is alert, awake, and oriented x3  with no focal motor or sensory deficits appreciated bilaterally.  SKIN: Moist and warm with no rashes appreciated.  Psych: Not anxious, depressed LN: No inguinal LN enlargement    Antibiotics   Anti-infectives (From admission, onward)   Start     Dose/Rate Route Frequency Ordered Stop   01/16/17 1100  Ampicillin-Sulbactam (UNASYN) 3 g in sodium chloride 0.9 % 100 mL IVPB     3 g 200 mL/hr over 30 Minutes Intravenous Every 6 hours 01/16/17 1051     01/13/17 2000  azithromycin (ZITHROMAX) 500 mg in dextrose 5 % 250 mL IVPB  Status:  Discontinued     500 mg 250 mL/hr over 60 Minutes Intravenous Every 24 hours 01/13/17 1853 01/16/17 1046   01/13/17 1915  cefTRIAXone (ROCEPHIN) 2 g in dextrose 5 % 50 mL IVPB  Status:  Discontinued     2 g 100 mL/hr over 30 Minutes Intravenous Every 24 hours 01/13/17 1902 01/16/17 1046   01/13/17 1445  vancomycin (VANCOCIN) IVPB 1000 mg/200 mL premix     1,000 mg 200 mL/hr over 60 Minutes Intravenous  Once 01/13/17 1435 01/13/17 1550   01/13/17 1445  piperacillin-tazobactam (ZOSYN) IVPB 3.375 g     3.375 g 100 mL/hr over 30 Minutes Intravenous  Once 01/13/17 1435 01/13/17 1520      Medications   Scheduled Meds: . amLODipine  10 mg Oral Daily  . budesonide (PULMICORT) nebulizer solution  0.25 mg Nebulization BID  . enoxaparin (LOVENOX) injection  40 mg Subcutaneous Q24H  . finasteride  5 mg Oral Daily  . guaiFENesin  600 mg Oral BID  . insulin aspart  0-5 Units Subcutaneous QHS  . insulin aspart  0-9 Units Subcutaneous TID WC  . insulin glargine  15 Units Subcutaneous QHS  . ipratropium-albuterol  3 mL Nebulization TID  . lisinopril  10 mg Oral Daily  . methylPREDNISolone (SOLU-MEDROL) injection  60 mg Intravenous Q8H  . metoprolol tartrate  25 mg Oral BID  . multivitamin with minerals  1 tablet Oral Daily  . pantoprazole  40 mg Oral Daily  . tamsulosin  0.4 mg Oral Daily   Continuous Infusions: . ampicillin-sulbactam (UNASYN) IV Stopped  (01/16/17 1158)   PRN Meds:.acetaminophen **OR** acetaminophen, ipratropium-albuterol, ondansetron **OR** ondansetron (ZOFRAN) IV, oxyCODONE, polyethylene glycol, QUEtiapine   Data Review:   Micro Results Recent Results (from the past 240 hour(s))  Blood culture (routine x 2)     Status: None (Preliminary result)   Collection Time: 01/13/17  1:35 PM  Result Value Ref Range Status   Specimen Description BLOOD LEFT ANTECUBITAL  Final   Special Requests   Final    BOTTLES DRAWN AEROBIC AND ANAEROBIC Blood Culture adequate volume   Culture   Final    NO GROWTH 3 DAYS Performed at [facility], [address] [place], [place] [address]    Report Status PENDING  Incomplete  Blood culture (routine x 2)     Status: None (Preliminary result)   Collection Time: 01/13/17  1:35 PM  Result Value Ref Range Status   Specimen Description BLOOD BLOOD LEFT ARM  Final   Special Requests   Final    BOTTLES DRAWN AEROBIC AND ANAEROBIC Blood Culture adequate volume   Culture   Final    NO GROWTH 3 DAYS Performed at [facility], [address], [place], [place] [address]    Report Status PENDING  Incomplete    Radiology Reports Dg Chest Port 1 View  Result Date: 01/16/2017 CLINICAL DATA:  Recent pneumonia EXAM: PORTABLE CHEST 1 VIEW COMPARISON:  01/13/2017 FINDINGS: Mild patchy opacities in the medial right upper lobe and bilateral lower lobes. No pleural effusion or pneumothorax. The heart is normal in size. IMPRESSION: Mild patchy opacities in the medial right upper lobe and bilateral lower lobes, likely reflecting mild pneumonia. Electronically Signed   By: Julian Hy M.D.   On: 01/16/2017 09:55   Dg Chest Port 1 View  Result Date: 01/13/2017 CLINICAL DATA:  Shortness of breath. EXAM: PORTABLE CHEST 1 VIEW COMPARISON:  CT 10/20/2014.  Chest x-ray 08/29/2014 . FINDINGS:  Stable mediastinal fullness consistent with prominent great vessels. Stable cardiomegaly. Mild right  upper lobe infiltrate. No pleural effusion or pneumothorax. Degenerative changes thoracic spine . IMPRESSION: Mild right upper lobe infiltrate suggesting pneumonia. Close follow-up chest x-rays to demonstrate clearing suggested. Electronically Signed   By: Marcello Moores  Register   On: 01/13/2017 14:23     CBC Recent Labs  Lab 01/13/17 1335 01/14/17 0514 01/15/17 0408  WBC 7.2 6.4 5.2  HGB 11.9* 11.2* 11.5*  HCT 35.4* 34.4* 34.2*  PLT 218 198 200  MCV 85.8 85.9 85.3  MCH 28.9 28.0 28.7  MCHC 33.6 32.6 33.6  RDW 15.6* 15.7* 15.2*  LYMPHSABS 1.9  --   --   MONOABS 0.8  --   --   EOSABS 0.0  --   --   BASOSABS 0.0  --   --     Chemistries  Recent Labs  Lab 01/13/17 1335 01/14/17 0514 01/15/17 0408  NA 137 136 136  K 4.1 4.4 3.8  CL 104 102 99*  CO2 25 27 24   GLUCOSE 192* 174* 170*  BUN 34* 32* 46*  CREATININE 1.38* 1.53* 1.48*  CALCIUM 8.6* 8.2* 8.4*   ------------------------------------------------------------------------------------------------------------------ estimated creatinine clearance is 36.4 mL/min (A) (by C-G formula based on SCr of 1.48 mg/dL (H)). ------------------------------------------------------------------------------------------------------------------ No results for input(s): HGBA1C in the last 72 hours. ------------------------------------------------------------------------------------------------------------------ No results for input(s): CHOL, HDL, LDLCALC, TRIG, CHOLHDL, LDLDIRECT in the last 72 hours. ------------------------------------------------------------------------------------------------------------------ No results for input(s): TSH, T4TOTAL, T3FREE, THYROIDAB in the last 72 hours.  Invalid input(s): FREET3 ------------------------------------------------------------------------------------------------------------------ No results for input(s): VITAMINB12, FOLATE, FERRITIN, TIBC, IRON, RETICCTPCT in the last 72 hours.  Coagulation  profile No results for input(s): INR, PROTIME in the last 168 hours.  No results for input(s): DDIMER in the last 72 hours.  Cardiac Enzymes Recent Labs  Lab 01/13/17 1335  TROPONINI 0.11*   ------------------------------------------------------------------------------------------------------------------ Invalid input(s): POCBNP    Assessment & Plan   Xaidyn Kepner  is a 84 y.o. male with a known history of stroke, and diabetes mellitus, hypertension, GERD, CK D stage III brought in from home secondary to worsening shortness of breath for almost a week now.  1.   Acute respiratory failure Due to pneumonia patient's chest x-ray worse concern for aspiration pneumonia we will change him to Unasyn Discontinue CAP treatment  2. Elevated troponin-likely demand ischemia secondary Oxygen respiratory distress.  No further workup needed  3.  Acute on chronic COPD exacerbation continue on steroids and DuoNeb's. Also inhalers and monitor. Continue oxygen  4. Acute renal insufficiency on CK D-  Repeat bmp in am  5. Diabetes mellitus-continue sliding scale insulin add low-dose Lantus  6. Hypertension-continue metoprolol, Norvasc and lisinopril  7. DVT prophylaxis-Lovenox       Code Status Orders  (From admission, onward)        Start     Ordered   01/13/17 1854  Do not attempt resuscitation (DNR)  Continuous    Question Answer Comment  In the event of cardiac or respiratory ARREST Do not call a "code blue"   In the event of cardiac or respiratory ARREST Do not perform Intubation, CPR, defibrillation or ACLS   In the event of cardiac or respiratory ARREST Use medication by any route, position, wound care, and other measures to relive pain and suffering. May use oxygen, suction and manual treatment of airway obstruction as needed for comfort.      01/13/17 1853    Code Status History  Date Active Date Inactive Code Status Order ID Comments User Context   10/10/2014  20:04 10/11/2014 20:27 DNR [id number]  Gladstone Lighter, MD Inpatient   08/29/2014 23:39 08/30/2014 21:06 Full Code [id number]  Lance Coon, MD Inpatient   03/02/2012 21:35 03/04/2012 18:03 Full Code [id number]  Leone Haven, MD Inpatient    Advance Directive Documentation     Most Recent Value  Type of Advance Directive  Healthcare Power of Attorney  Pre-existing out of facility DNR order (yellow form or pink MOST form)  Physician notified to receive inpatient order, Yellow form placed in chart (order not valid for inpatient use)  "MOST" Form in Place?  No data           Consults none  DVT Prophylaxis  Lovenox   Lab Results  Component Value Date   PLT 200 01/15/2017     Time Spent in minutes 30 minutes  Greater than 50% of time spent in care coordination and counseling patient regarding the condition and plan of care.   Dustin Flock M.D on 01/16/2017 at 1:22 PM  Between 7am to 6pm - Pager - [phone]  After 6pm go to [email or web address] - password [email or web address] [facility]   Office  [phone]

## 2017-01-16 NOTE — Progress Notes (Signed)
Pharmacy Antibiotic Note  Roberto Griffin is a 84 y.o. male admitted on 01/13/2017 with aspiration pneumonia.  Pharmacy has been consulted for unasyn dosing.  Plan: unasyn 3gm iv q6h   Height: 6' (182.9 cm) Weight: 198 lb (89.8 kg) IBW/kg (Calculated) : 77.6  Temp (24hrs), Avg:97.7 F (36.5 C), Min:97.6 F (36.4 C), Max:97.8 F (36.6 C)  Recent Labs  Lab 01/13/17 1335 01/14/17 0514 01/15/17 0408  WBC 7.2 6.4 5.2  CREATININE 1.38* 1.53* 1.48*  LATICACIDVEN 2.7*  --   --     Estimated Creatinine Clearance: 36.4 mL/min (A) (by C-G formula based on SCr of 1.48 mg/dL (H)).    No Known Allergies  Antimicrobials this admission: Anti-infectives (From admission, onward)   Start     Dose/Rate Route Frequency Ordered Stop   01/16/17 1100  Ampicillin-Sulbactam (UNASYN) 3 g in sodium chloride 0.9 % 100 mL IVPB     3 g 200 mL/hr over 30 Minutes Intravenous Every 6 hours 01/16/17 1051     01/13/17 2000  azithromycin (ZITHROMAX) 500 mg in dextrose 5 % 250 mL IVPB  Status:  Discontinued     500 mg 250 mL/hr over 60 Minutes Intravenous Every 24 hours 01/13/17 1853 01/16/17 1046   01/13/17 1915  cefTRIAXone (ROCEPHIN) 2 g in dextrose 5 % 50 mL IVPB  Status:  Discontinued     2 g 100 mL/hr over 30 Minutes Intravenous Every 24 hours 01/13/17 1902 01/16/17 1046   01/13/17 1445  vancomycin (VANCOCIN) IVPB 1000 mg/200 mL premix     1,000 mg 200 mL/hr over 60 Minutes Intravenous  Once 01/13/17 1435 01/13/17 1550   01/13/17 1445  piperacillin-tazobactam (ZOSYN) IVPB 3.375 g     3.375 g 100 mL/hr over 30 Minutes Intravenous  Once 01/13/17 1435 01/13/17 1520     Microbiology results: Recent Results (from the past 240 hour(s))  Blood culture (routine x 2)     Status: None (Preliminary result)   Collection Time: 01/13/17  1:35 PM  Result Value Ref Range Status   Specimen Description BLOOD LEFT ANTECUBITAL  Final   Special Requests   Final    BOTTLES DRAWN AEROBIC AND ANAEROBIC Blood Culture  adequate volume   Culture   Final    NO GROWTH 3 DAYS Performed at [facility], [address] [place], [place] [address]    Report Status PENDING  Incomplete  Blood culture (routine x 2)     Status: None (Preliminary result)   Collection Time: 01/13/17  1:35 PM  Result Value Ref Range Status   Specimen Description BLOOD BLOOD LEFT ARM  Final   Special Requests   Final    BOTTLES DRAWN AEROBIC AND ANAEROBIC Blood Culture adequate volume   Culture   Final    NO GROWTH 3 DAYS Performed at [facility], [address] [place], [place] [address]    Report Status PENDING  Incomplete     Thank you for allowing pharmacy to be a part of this patient's care.  Donna Christen Saxton Chain 01/16/2017 10:51 AM

## 2017-01-17 ENCOUNTER — Inpatient Hospital Stay: Payer: 59

## 2017-01-17 LAB — GLUCOSE, CAPILLARY
GLUCOSE-CAPILLARY: 165 mg/dL — AB (ref 65–99)
GLUCOSE-CAPILLARY: 167 mg/dL — AB (ref 65–99)

## 2017-01-17 LAB — CBC
HCT: 35.3 % — ABNORMAL LOW (ref 40.0–52.0)
Hemoglobin: 11.8 g/dL — ABNORMAL LOW (ref 13.0–18.0)
MCH: 28.6 pg (ref 26.0–34.0)
MCHC: 33.5 g/dL (ref 32.0–36.0)
MCV: 85.4 fL (ref 80.0–100.0)
Platelets: 214 10*3/uL (ref 150–440)
RBC: 4.13 MIL/uL — ABNORMAL LOW (ref 4.40–5.90)
RDW: 15.6 % — ABNORMAL HIGH (ref 11.5–14.5)
WBC: 5.7 10*3/uL (ref 3.8–10.6)

## 2017-01-17 LAB — BASIC METABOLIC PANEL
Anion gap: 10 (ref 5–15)
BUN: 52 mg/dL — ABNORMAL HIGH (ref 6–20)
CALCIUM: 8.8 mg/dL — AB (ref 8.9–10.3)
CO2: 27 mmol/L (ref 22–32)
Chloride: 105 mmol/L (ref 101–111)
Creatinine, Ser: 1.16 mg/dL (ref 0.61–1.24)
GFR, EST NON AFRICAN AMERICAN: 54 mL/min — AB (ref >60)
Glucose, Bld: 185 mg/dL — ABNORMAL HIGH (ref 65–99)
POTASSIUM: 3.4 mmol/L — AB (ref 3.5–5.1)
SODIUM: 142 mmol/L (ref 135–145)

## 2017-01-17 MED ORDER — MORPHINE SULFATE (CONCENTRATE) 10 MG/0.5ML PO SOLN: 5.0000 mg | ORAL | 0 refills | Status: AC | PRN

## 2017-01-17 MED ORDER — LORAZEPAM 0.5 MG PO TABS: 0.5000 mg | ORAL_TABLET | Freq: Four times a day (QID) | ORAL | 0 refills | Status: AC | PRN

## 2017-01-17 MED ORDER — AMOXICILLIN-POT CLAVULANATE 875-125 MG PO TABS: 1.0000 | ORAL_TABLET | Freq: Two times a day (BID) | ORAL | 0 refills | Status: AC

## 2017-01-17 NOTE — Plan of Care (Signed)
Per Dr. Manuella Ghazi, patient does not need to be seen by palliative. Home hospice.

## 2017-01-17 NOTE — Discharge Instructions (Signed)
Hospice Introduction Hospice is a service that is designed to provide people who are terminally ill and their families with medical, spiritual, and psychological support. Its aim is to improve your quality of life by keeping you as alert and comfortable as possible. Who will be my providers when I begin hospice care? Hospice teams often include:  A nurse.  A doctor. The hospice doctor will be available for your care, but you can bring your regular doctor or nurse practitioner.  Social workers.  Religious leaders (such as a Clinical biochemist).  Trained volunteers.  What roles will providers play in my care? Hospice is performed by a team of health care professionals and volunteers who:  Help keep you comfortable: ? Hospice can be provided in your home or in a homelike setting. ? The hospice staff works with your family and friends to help meet your needs. ? You will enjoy the support of loved ones by receiving much of your basic care from family and friends.  Provide pain relief and manage your symptoms. The staff supply all necessary medicines and equipment.  Provide companionship when you are alone.  Allow you and your family to rest. They may do light housekeeping, prepare meals, and run errands.  Provide counseling. They will make sure your emotional, spiritual, and social needs and those of your family are being met.  Provide spiritual care: ? Spiritual care will be individualized to meet your needs and your family's needs. ? Spiritual care may involve:  Helping you look at what death means to you.  Helping you say goodbye to your family and friends.  Performing a specific religious ceremony or ritual.  When should hospice care begin? Most people who use hospice are believed to have fewer than 6 months to live.  Your family and health care providers can help you decide when hospice services should begin.  If your condition improves, you may discontinue the program.  What  should I consider before selecting a program? Most hospice programs are run by nonprofit, independent organizations. Some are affiliated with hospitals, nursing homes, or home health care agencies. Hospice programs can take place in the home or at a hospice center, hospital, or skilled nursing facility. When choosing a hospice program, ask the following questions:  What services are available to me?  What services will be offered to my loved ones?  How involved will my loved ones be?  How involved will my health care provider be?  Who makes up the hospice care team? How are they trained or screened?  How will my pain and symptoms be managed?  If my circumstances change, can the services be provided in a different setting, such as my home or in the hospital?  Is the program reviewed and licensed by the state or certified in some other way?  Where can I learn more about hospice? You can learn about existing hospice programs in your area from your health care providers. You can also read more about hospice online. The websites of the following organizations contain helpful information:  The [facility] and Palliative Care Organization [facility]).  The Hospice Association of America ([place]).  The [place].  The American Cancer Society (ACS).  Hospice Net.  This information is not intended to replace advice given to you by your health care provider. Make sure you discuss any questions you have with your health care provider. Document Released: 04/16/2003 Document Revised: 08/14/2015 Document Reviewed: 11/07/2012 Elsevier Interactive Patient Education  2017 [employer].

## 2017-01-17 NOTE — Clinical Social Work Note (Signed)
Clinical Social Work Assessment  Patient Details  Name: Roberto Griffin MRN: [id number] Date of Birth: Jul 26, 1924  Date of referral:  01/17/17               Reason for consult:  Discharge Planning, End of Life/Hospice, Facility Placement                Permission sought to share information with:    Permission granted to share information::     Name::        Agency::     Relationship::     Contact Information:     Housing/Transportation Living arrangements for the past 2 months:  Single Family Home Source of Information:  Adult Children, Power of Attorney Patient Interpreter Needed:  None Criminal Activity/Legal Involvement Pertinent to Current Situation/Hospitalization:  No - Comment as needed Significant Relationships:  Adult Children Lives with:  Self, Adult Children(Adult children Live beside patient. ) Do you feel safe going back to the place where you live?    Need for family participation in patient care:  Yes (Comment)  Care giving concerns:  Patient lives in [place] alone and is followed by [facility] at home.    Social Worker assessment / plan:  Holiday representative (CSW) reviewed chart and noted that PT is recommending SNF. CSW attempted to meet with patient however he was asleep and no family was at bedside. CSW contacted patient's daughter/ HPOA Roberto Griffin. Per daughter patient lives alone and has [facility] at home. Per daughter herself and her 2 brothers live next door to patient and help him often. Daughter reported that patient also has another daughter that lives in [place]. Per Roberto Griffin she is HPOA and makes decisions for patient. CSW explained that PT is recommending SNF however patient would have to stop hospice in order to go to short term rehab at a SNF. Roberto Griffin reported that she doesn't believe patient will do well in rehab and she wants him to continue hospice.   Roberto Griffin spoke to Dr. Manuella Ghazi and is in agreement with patient discharging home and continuing  [facility] today. Roberto Griffin reported that the [facility] nurse is asking for MD to call in comfort meds. Per Roberto Griffin she and her brother will transport patient today. RN case Freight forwarder and MD aware of above. Please reconsult if future social work needs arise. CSW signing off.    Employment status:  Disabled (Comment on whether or not currently receiving Disability) Insurance information:  Other (Comment Required)([facility]. ) PT Recommendations:  [place] / Referral to community resources:  Other (Comment Required)(Patient will D/C home and contiune [facility]. )  Patient/Family's Response to care:  Patient's daughter/ HPOA Roberto Griffin is agreeable for patient to D/C home today.   Patient/Family's Understanding of and Emotional Response to Diagnosis, Current Treatment, and Prognosis:  Roberto Griffin understands that patient is on hospice and the focus is on end of life comfort care.   Emotional Assessment Appearance:  Appears stated age Attitude/Demeanor/Rapport:  Unable to Assess Affect (typically observed):  Unable to Assess Orientation:  Oriented to Self, Fluctuating Orientation (Suspected and/or reported Sundowners) Alcohol / Substance use:  Not Applicable Psych involvement (Current and /or in the community):  No (Comment)  Discharge Needs  Concerns to be addressed:  No discharge needs identified Readmission within the last 30 days:  No Current discharge risk:  None Barriers to Discharge:  No Barriers Identified   [name], Veronia Beets, LCSW 01/17/2017, 11:27 AM

## 2017-01-17 NOTE — Care Management (Signed)
Discharge to home today per Manuella Ghazi. Will be followed by [facility]. Family would like to transport per car. Shelbie Ammons RN MSN CCM Care Management [phone]

## 2017-01-17 NOTE — Consult Note (Addendum)
Modified Barium Swallow Progress Note  Patient Details  Name: Roberto Griffin MRN: [id number] Date of Birth: 05/17/1924  Today's Date: 01/17/2017  Modified Barium Swallow completed.  Full report located under Chart Review in the Imaging Section.  Brief recommendations include the following:  Clinical Impression Pt presents with functional oral swallow, Moderate pharyngeal dysphagia, characterized by delayed swallow reflex across consistencies (at pyriform on nectar thick liquid, vallecula on honey thick liquid, puree, and solid), due to decreased sensation for primary trigger site. Post-swallow residue was noted across consistencies due to decreased tongue base retraction and pharyngeal constriction. Penetration to the level of the vocal cords was noted on nectar thick liquid via cup sip, with high aspiration risk. SILENT Trace aspiration was noted on nectar thick liquids via straw. Penetration and aspiration were noted to occur both before and during the swallow, due to delayed swallow reflex and poor airway closure. Cues to cough/clear throat were minimally successful at removing penetrate. Post swallow residue was noted to clear with cued dry swallows.    At this time, recommend puree diet and honey thick liquids, crushed meds, 1:1 supervision, 2 swallows per bite/sip. With improvement in strength and endurance, solids may be advanced to soft/finely chopped, however, recommend continuing honey thick liquids due to silent aspiration of thinner consistencies.    Pt's daughter was educated regarding results and recommendations, and was provided the opportunity to ask questions. SLP provided samples of honey thickening gel, which she indicates the [place] sends to pt.  Also recommend thickening ensure to honey consistency to reduce aspiration risk. Pt is at risk for aspiration with all po intake, but especially when fatigued, and when safe swallow strategies are not followed.   Swallow Evaluation  Recommendations  SLP Diet Recommendations: Dysphagia 1 (Puree) solids;Honey thick liquids   Liquid Administration via: Cup;No straw   Medication Administration: Crushed with puree   Supervision: Staff to assist with self feeding;Patient able to self feed;Full supervision/cueing for compensatory strategies   Compensations: Minimize environmental distractions;Slow rate;Small sips/bites   Postural Changes: Remain semi-upright after after feeds/meals (Comment)   Oral Care Recommendations: Oral care BID   Other Recommendations: Order thickener from pharmacy;Remove water pitcher;Prohibited food (jello, ice cream, thin soups)  Celia B. Quentin Ore, [facility] [place] Speech Language Pathologist [phone]  Shonna Chock 01/17/2017,12:54 PM

## 2017-01-18 LAB — CULTURE, BLOOD (ROUTINE X 2)
Culture: NO GROWTH
Culture: NO GROWTH
SPECIAL REQUESTS: ADEQUATE
SPECIAL REQUESTS: ADEQUATE

## 2017-01-19 NOTE — Discharge Summary (Signed)
Roberto Griffin NAME: Roberto Griffin    MR#:  [id number]  DATE OF BIRTH:  07-19-1924  DATE OF ADMISSION:  01/13/2017   ADMITTING PHYSICIAN: Gladstone Lighter, MD  DATE OF DISCHARGE: 01/17/2017  2:00 PM  PRIMARY CARE PHYSICIAN: Morton Peters., MD   ADMISSION DIAGNOSIS:  Shortness of breath [R06.02] Elevated troponin I level [R74.8] Elevated lactic acid level [R79.89] Community acquired pneumonia of right middle lobe of lung ([place]) [J18.1] DISCHARGE DIAGNOSIS:  Active Problems:   Pneumonia  SECONDARY DIAGNOSIS:   Past Medical History:  Diagnosis Date  . BPH (benign prostatic hyperplasia)   . CKD (chronic kidney disease), stage III (Roberto Griffin)   . CVA (cerebral infarction)   . Diabetes mellitus without complication (Roberto Griffin)   . GERD (gastroesophageal reflux disease)   . Hypertension    HOSPITAL COURSE:  WilliamMooreis a89 y.o.malewith a known history of stroke, and diabetes mellitus, hypertension, GERD, CK D stage III brought in from home secondary to worsening shortness of breath  1.  Acute respiratory failure Due to aspiration pneumonia  - he remains at high risk for recurrent aspirations  2. Elevated troponin-likely demand ischemia secondary Oxygen respiratory distress.   3.  Acute on chronic COPD exacerbation: Improved with steroids and DuoNeb's.   4. Acute renal insufficiency on CK D 3  At baseline  5.Diabetes mellitus  6. Hypertension-continue metoprolol,Norvasc and lisinopril DISCHARGE CONDITIONS:  stable CONSULTS OBTAINED:   DRUG ALLERGIES:  No Known Allergies DISCHARGE MEDICATIONS:   Allergies as of 01/17/2017   No Known Allergies     Medication List    STOP taking these medications   cephALEXin 500 MG capsule Commonly known as:  KEFLEX   hydrochlorothiazide 25 MG tablet Commonly known as:  HYDRODIURIL   metFORMIN 500 MG tablet Commonly known as:  GLUCOPHAGE   multivitamin with  minerals Tabs tablet   omeprazole 20 MG capsule Commonly known as:  PRILOSEC   oxyCODONE 5 MG immediate release tablet Commonly known as:  Oxy IR/ROXICODONE     TAKE these medications   amoxicillin-clavulanate 875-125 MG tablet Commonly known as:  AUGMENTIN Take 1 tablet by mouth every 12 (twelve) hours for 7 days.   aspirin EC 81 MG tablet Take 81 mg by mouth daily.   finasteride 5 MG tablet Commonly known as:  PROSCAR Take 1 tablet by mouth daily.   lisinopril 10 MG tablet Commonly known as:  PRINIVIL Take 1 tablet (10 mg total) by mouth daily.   LORazepam 0.5 MG tablet Commonly known as:  ATIVAN Take 1 tablet (0.5 mg total) by mouth every 6 (six) hours as needed for anxiety.   metoprolol tartrate 25 MG tablet Commonly known as:  LOPRESSOR Take 1 tablet (25 mg total) by mouth 2 (two) times daily.   morphine CONCENTRATE 10 MG/0.5ML Soln concentrated solution Take 0.25 mLs (5 mg total) by mouth every 2 (two) hours as needed for severe pain, anxiety or shortness of breath.   MUCINEX 600 MG 12 hr tablet Generic drug:  guaiFENesin Take 1 tablet by mouth 2 (two) times daily.   polyethylene glycol packet Commonly known as:  MIRALAX / GLYCOLAX Take 17 g by mouth daily as needed for mild constipation.   tamsulosin 0.4 MG Caps capsule Commonly known as:  FLOMAX Take 1 capsule by mouth daily.        DISCHARGE INSTRUCTIONS:  SLP Diet Recommendations: Dysphagia 1 (Puree) solids;Honey thick liquids   Liquid Administration via:  Cup;No straw   Medication Administration: Crushed with puree   Supervision: Staff to assist with self feeding;Patient able to self feed;Full supervision/cueing for compensatory strategies   Compensations: Minimize environmental distractions;Slow rate;Small sips/bites   Postural Changes: Remain semi-upright after after feeds/meals (Comment)   Oral Care Recommendations: Oral care BID   Other Recommendations: Order thickener from  pharmacy;Remove water pitcher;Prohibited food (jello, ice cream, thin soups) DIET:  Regular diet DISCHARGE CONDITION:  Good ACTIVITY:  Activity as tolerated OXYGEN:  Home Oxygen: No.  Oxygen Delivery: room air DISCHARGE LOCATION:  home - followed by [facility]    If you experience worsening of your admission symptoms, develop shortness of breath, life threatening emergency, suicidal or homicidal thoughts you must seek medical attention immediately by calling 911 or calling your MD immediately  if symptoms less severe.  You Must read complete instructions/literature along with all the possible adverse reactions/side effects for all the Medicines you take and that have been prescribed to you. Take any new Medicines after you have completely understood and accpet all the possible adverse reactions/side effects.   Please note  You were cared for by a hospitalist during your hospital stay. If you have any questions about your discharge medications or the care you received while you were in the hospital after you are discharged, you can call the unit and asked to speak with the hospitalist on call if the hospitalist that took care of you is not available. Once you are discharged, your primary care physician will handle any further medical issues. Please note that NO REFILLS for any discharge medications will be authorized once you are discharged, as it is imperative that you return to your primary care physician (or establish a relationship with a primary care physician if you do not have one) for your aftercare needs so that they can reassess your need for medications and monitor your lab values.    On the day of Discharge:  VITAL SIGNS:  Blood pressure (!) 145/53, pulse 84, temperature (!) 97.4 F (36.3 C), temperature source Oral, resp. rate (!) 22, height 6' (1.829 m), weight 89.8 kg (198 lb), SpO2 96 %. PHYSICAL EXAMINATION:  GENERAL:  84 y.o.-year-old patient lying in the bed with  no acute distress.  EYES: Pupils equal, round, reactive to light and accommodation. No scleral icterus. Extraocular muscles intact.  HEENT: Head atraumatic, normocephalic. Oropharynx and nasopharynx clear.  NECK:  Supple, no jugular venous distention. No thyroid enlargement, no tenderness.  LUNGS: Normal breath sounds bilaterally, no wheezing, rales,rhonchi or crepitation. No use of accessory muscles of respiration.  CARDIOVASCULAR: S1, S2 normal. No murmurs, rubs, or gallops.  ABDOMEN: Soft, non-tender, non-distended. Bowel sounds present. No organomegaly or mass.  EXTREMITIES: No pedal edema, cyanosis, or clubbing.  NEUROLOGIC: Cranial nerves II through XII are intact. Muscle strength 5/5 in all extremities. Sensation intact. Gait not checked.  PSYCHIATRIC: The patient is alert and oriented x 3.  SKIN: No obvious rash, lesion, or ulcer.  DATA REVIEW:   CBC Recent Labs  Lab 01/17/17 0400  WBC 5.7  HGB 11.8*  HCT 35.3*  PLT 214    Chemistries  Recent Labs  Lab 01/17/17 0400  NA 142  K 3.4*  CL 105  CO2 27  GLUCOSE 185*  BUN 52*  CREATININE 1.16  CALCIUM 8.8*    Follow-up Information    Morton Peters., MD. Schedule an appointment as soon as possible for a visit in 1 week(s).   Specialty:  Family  Medicine Contact information: [place] [address] [phone]           Management plans discussed with the patient, family and they are in agreement.  CODE STATUS: DNR  TOTAL TIME TAKING CARE OF THIS PATIENT: 45 minutes.    Max Sane M.D on 01/19/2017 at 5:21 PM  Between 7am to 6pm - Pager - [phone]  After 6pm go to [email or web address] - password [email or web address] [facility]  [employer] [place] Hospitalists  Office  [phone]  CC: Primary care physician; Morton Peters., MD   Note: This dictation was prepared with Dragon dictation along with smaller phrase technology. Any transcriptional errors that result from this process are  unintentional.

## 2017-04-11 DEATH — deceased
